# Patient Record
Sex: Male | Born: 1935 | Race: White | Hispanic: No | Smoking: Former smoker
Health system: Southern US, Community
[De-identification: ages and names within clinical notes are randomized; demographics above are authoritative.]

## PROBLEM LIST (undated history)

## (undated) DIAGNOSIS — IMO0002 Reserved for concepts with insufficient information to code with codable children: Secondary | ICD-10-CM

## (undated) DIAGNOSIS — I219 Acute myocardial infarction, unspecified: Secondary | ICD-10-CM

## (undated) DIAGNOSIS — Z789 Other specified health status: Secondary | ICD-10-CM

## (undated) DIAGNOSIS — K449 Diaphragmatic hernia without obstruction or gangrene: Secondary | ICD-10-CM

## (undated) DIAGNOSIS — E785 Hyperlipidemia, unspecified: Secondary | ICD-10-CM

## (undated) DIAGNOSIS — I2510 Atherosclerotic heart disease of native coronary artery without angina pectoris: Secondary | ICD-10-CM

## (undated) DIAGNOSIS — G629 Polyneuropathy, unspecified: Secondary | ICD-10-CM

## (undated) DIAGNOSIS — J189 Pneumonia, unspecified organism: Secondary | ICD-10-CM

## (undated) DIAGNOSIS — I779 Disorder of arteries and arterioles, unspecified: Secondary | ICD-10-CM

## (undated) DIAGNOSIS — K219 Gastro-esophageal reflux disease without esophagitis: Secondary | ICD-10-CM

## (undated) DIAGNOSIS — M1990 Unspecified osteoarthritis, unspecified site: Secondary | ICD-10-CM

## (undated) DIAGNOSIS — I739 Peripheral vascular disease, unspecified: Secondary | ICD-10-CM

## (undated) DIAGNOSIS — K279 Peptic ulcer, site unspecified, unspecified as acute or chronic, without hemorrhage or perforation: Secondary | ICD-10-CM

## (undated) DIAGNOSIS — Z951 Presence of aortocoronary bypass graft: Secondary | ICD-10-CM

## (undated) DIAGNOSIS — Z974 Presence of external hearing-aid: Secondary | ICD-10-CM

## (undated) DIAGNOSIS — I10 Essential (primary) hypertension: Secondary | ICD-10-CM

## (undated) DIAGNOSIS — R634 Abnormal weight loss: Secondary | ICD-10-CM

## (undated) DIAGNOSIS — R9430 Abnormal result of cardiovascular function study, unspecified: Secondary | ICD-10-CM

## (undated) HISTORY — PX: JOINT REPLACEMENT: SHX530

## (undated) HISTORY — PX: CORONARY ARTERY BYPASS GRAFT: SHX141

## (undated) HISTORY — DX: Presence of aortocoronary bypass graft: Z95.1

## (undated) HISTORY — DX: Essential (primary) hypertension: I10

## (undated) HISTORY — DX: Abnormal weight loss: R63.4

## (undated) HISTORY — DX: Hyperlipidemia, unspecified: E78.5

## (undated) HISTORY — DX: Other specified health status: Z78.9

## (undated) HISTORY — DX: Polyneuropathy, unspecified: G62.9

## (undated) HISTORY — DX: Disorder of arteries and arterioles, unspecified: I77.9

## (undated) HISTORY — DX: Reserved for concepts with insufficient information to code with codable children: IMO0002

## (undated) HISTORY — PX: HAND SURGERY: SHX662

## (undated) HISTORY — DX: Peripheral vascular disease, unspecified: I73.9

## (undated) HISTORY — DX: Abnormal result of cardiovascular function study, unspecified: R94.30

## (undated) HISTORY — DX: Atherosclerotic heart disease of native coronary artery without angina pectoris: I25.10

## (undated) HISTORY — PX: CARDIAC CATHETERIZATION: SHX172

## (undated) HISTORY — DX: Diaphragmatic hernia without obstruction or gangrene: K44.9

## (undated) HISTORY — DX: Peptic ulcer, site unspecified, unspecified as acute or chronic, without hemorrhage or perforation: K27.9

---

## 1998-05-18 ENCOUNTER — Encounter: Payer: Self-pay | Admitting: Gastroenterology

## 1998-05-18 ENCOUNTER — Ambulatory Visit (HOSPITAL_COMMUNITY): Admission: RE | Admit: 1998-05-18 | Discharge: 1998-05-18 | Payer: Self-pay | Admitting: Gastroenterology

## 1998-10-05 ENCOUNTER — Encounter: Payer: Self-pay | Admitting: Gastroenterology

## 1998-10-05 ENCOUNTER — Ambulatory Visit (HOSPITAL_COMMUNITY): Admission: RE | Admit: 1998-10-05 | Discharge: 1998-10-05 | Payer: Self-pay | Admitting: Gastroenterology

## 2000-01-10 ENCOUNTER — Encounter: Admission: RE | Admit: 2000-01-10 | Discharge: 2000-01-10 | Payer: Self-pay | Admitting: Gastroenterology

## 2000-01-10 ENCOUNTER — Encounter: Payer: Self-pay | Admitting: Gastroenterology

## 2000-03-06 ENCOUNTER — Encounter: Payer: Self-pay | Admitting: Gastroenterology

## 2000-03-06 ENCOUNTER — Encounter: Admission: RE | Admit: 2000-03-06 | Discharge: 2000-03-06 | Payer: Self-pay | Admitting: Gastroenterology

## 2000-07-23 ENCOUNTER — Ambulatory Visit (HOSPITAL_COMMUNITY): Admission: RE | Admit: 2000-07-23 | Discharge: 2000-07-23 | Payer: Self-pay | Admitting: *Deleted

## 2000-07-29 ENCOUNTER — Encounter: Payer: Self-pay | Admitting: Thoracic Surgery (Cardiothoracic Vascular Surgery)

## 2000-07-31 ENCOUNTER — Inpatient Hospital Stay (HOSPITAL_COMMUNITY)
Admission: RE | Admit: 2000-07-31 | Discharge: 2000-08-06 | Payer: Self-pay | Admitting: Thoracic Surgery (Cardiothoracic Vascular Surgery)

## 2000-07-31 ENCOUNTER — Encounter: Payer: Self-pay | Admitting: Thoracic Surgery (Cardiothoracic Vascular Surgery)

## 2000-08-01 ENCOUNTER — Encounter: Payer: Self-pay | Admitting: Thoracic Surgery (Cardiothoracic Vascular Surgery)

## 2000-08-02 ENCOUNTER — Encounter: Payer: Self-pay | Admitting: Thoracic Surgery (Cardiothoracic Vascular Surgery)

## 2000-09-02 ENCOUNTER — Encounter (HOSPITAL_COMMUNITY): Admission: RE | Admit: 2000-09-02 | Discharge: 2000-12-01 | Payer: Self-pay | Admitting: Cardiology

## 2000-11-11 ENCOUNTER — Ambulatory Visit (HOSPITAL_COMMUNITY): Admission: RE | Admit: 2000-11-11 | Discharge: 2000-11-11 | Payer: Self-pay | Admitting: General Surgery

## 2000-12-02 ENCOUNTER — Encounter (HOSPITAL_COMMUNITY): Admission: RE | Admit: 2000-12-02 | Discharge: 2000-12-17 | Payer: Self-pay | Admitting: Cardiology

## 2001-06-17 ENCOUNTER — Ambulatory Visit (HOSPITAL_COMMUNITY): Admission: RE | Admit: 2001-06-17 | Discharge: 2001-06-17 | Payer: Self-pay | Admitting: Gastroenterology

## 2003-06-27 ENCOUNTER — Encounter: Admission: RE | Admit: 2003-06-27 | Discharge: 2003-06-27 | Payer: Self-pay | Admitting: Family Medicine

## 2004-09-27 ENCOUNTER — Ambulatory Visit: Payer: Self-pay | Admitting: Cardiology

## 2004-12-24 ENCOUNTER — Ambulatory Visit: Payer: Self-pay | Admitting: Cardiology

## 2005-06-06 ENCOUNTER — Ambulatory Visit: Payer: Self-pay | Admitting: Cardiology

## 2005-12-12 ENCOUNTER — Ambulatory Visit: Payer: Self-pay | Admitting: Cardiology

## 2005-12-24 ENCOUNTER — Ambulatory Visit: Payer: Self-pay | Admitting: Cardiology

## 2006-01-07 ENCOUNTER — Ambulatory Visit: Payer: Self-pay

## 2006-07-14 ENCOUNTER — Ambulatory Visit: Payer: Self-pay | Admitting: Cardiology

## 2006-07-14 LAB — CONVERTED CEMR LAB
Albumin: 3.9 g/dL (ref 3.5–5.2)
Bilirubin, Direct: 0.1 mg/dL (ref 0.0–0.3)
Cholesterol: 127 mg/dL (ref 0–200)
HDL: 31.9 mg/dL — ABNORMAL LOW (ref >39.0)
Total Bilirubin: 1 mg/dL (ref 0.3–1.2)
Total Protein: 6.6 g/dL (ref 6.0–8.3)
VLDL: 27 mg/dL (ref 0–40)

## 2006-12-04 ENCOUNTER — Ambulatory Visit: Payer: Self-pay | Admitting: Cardiology

## 2007-01-13 ENCOUNTER — Ambulatory Visit: Payer: Self-pay | Admitting: Cardiology

## 2007-01-13 LAB — CONVERTED CEMR LAB
AST: 21 units/L (ref 0–37)
Albumin: 3.8 g/dL (ref 3.5–5.2)
Alkaline Phosphatase: 66 units/L (ref 39–117)
Bilirubin, Direct: 0.2 mg/dL (ref 0.0–0.3)
HDL: 25.6 mg/dL — ABNORMAL LOW (ref >39.0)
LDL Cholesterol: 66 mg/dL (ref 0–99)
Total Bilirubin: 1 mg/dL (ref 0.3–1.2)
Total CHOL/HDL Ratio: 4.1
Triglycerides: 69 mg/dL (ref 0–149)
VLDL: 14 mg/dL (ref 0–40)

## 2007-05-20 ENCOUNTER — Encounter: Admission: RE | Admit: 2007-05-20 | Discharge: 2007-05-20 | Payer: Self-pay | Admitting: Otolaryngology

## 2007-07-16 ENCOUNTER — Ambulatory Visit: Payer: Self-pay | Admitting: Cardiology

## 2007-07-16 LAB — CONVERTED CEMR LAB
ALT: 15 units/L (ref 0–53)
AST: 21 units/L (ref 0–37)
Cholesterol: 117 mg/dL (ref 0–200)
HDL: 27.4 mg/dL — ABNORMAL LOW (ref >39.0)
LDL Cholesterol: 68 mg/dL (ref 0–99)
Total CHOL/HDL Ratio: 4.3
Total Protein: 6.4 g/dL (ref 6.0–8.3)

## 2008-01-12 ENCOUNTER — Ambulatory Visit: Payer: Self-pay | Admitting: Cardiology

## 2008-01-28 ENCOUNTER — Ambulatory Visit: Payer: Self-pay | Admitting: Cardiology

## 2008-01-28 LAB — CONVERTED CEMR LAB
Cholesterol: 119 mg/dL (ref 0–200)
Total Protein: 6.8 g/dL (ref 6.0–8.3)
VLDL: 13 mg/dL (ref 0–40)

## 2009-01-09 ENCOUNTER — Encounter: Payer: Self-pay | Admitting: Cardiology

## 2009-01-10 ENCOUNTER — Ambulatory Visit: Payer: Self-pay | Admitting: Cardiology

## 2009-01-11 ENCOUNTER — Ambulatory Visit: Payer: Self-pay | Admitting: Cardiology

## 2009-01-12 ENCOUNTER — Encounter: Payer: Self-pay | Admitting: Cardiology

## 2009-01-12 LAB — CONVERTED CEMR LAB
AST: 25 units/L (ref 0–37)
Albumin: 3.8 g/dL (ref 3.5–5.2)
Cholesterol: 132 mg/dL (ref 0–200)
HDL: 35.6 mg/dL — ABNORMAL LOW (ref >39.00)
LDL Cholesterol: 78 mg/dL (ref 0–99)
Triglycerides: 90 mg/dL (ref 0.0–149.0)
VLDL: 18 mg/dL (ref 0.0–40.0)

## 2009-07-04 ENCOUNTER — Encounter: Payer: Self-pay | Admitting: Cardiology

## 2009-07-12 ENCOUNTER — Ambulatory Visit: Payer: Self-pay | Admitting: Cardiology

## 2009-07-13 ENCOUNTER — Encounter: Payer: Self-pay | Admitting: Cardiology

## 2009-07-13 LAB — CONVERTED CEMR LAB
ALT: 18 units/L (ref 0–53)
AST: 25 units/L (ref 0–37)
Albumin: 4 g/dL (ref 3.5–5.2)
Alkaline Phosphatase: 59 units/L (ref 39–117)
Bilirubin, Direct: 0.2 mg/dL (ref 0.0–0.3)
Cholesterol: 134 mg/dL (ref 0–200)
LDL Cholesterol: 65 mg/dL (ref 0–99)
Total Bilirubin: 0.8 mg/dL (ref 0.3–1.2)
Total CHOL/HDL Ratio: 3
Total Protein: 6.7 g/dL (ref 6.0–8.3)
Triglycerides: 153 mg/dL — ABNORMAL HIGH (ref 0.0–149.0)

## 2009-08-21 ENCOUNTER — Telehealth (INDEPENDENT_AMBULATORY_CARE_PROVIDER_SITE_OTHER): Payer: Self-pay | Admitting: *Deleted

## 2009-08-23 ENCOUNTER — Encounter: Admission: RE | Admit: 2009-08-23 | Discharge: 2009-08-23 | Payer: Self-pay | Admitting: Orthopedic Surgery

## 2009-08-28 ENCOUNTER — Inpatient Hospital Stay (HOSPITAL_COMMUNITY): Admission: RE | Admit: 2009-08-28 | Discharge: 2009-09-04 | Payer: Self-pay | Admitting: Orthopedic Surgery

## 2009-09-27 ENCOUNTER — Telehealth: Payer: Self-pay | Admitting: Cardiology

## 2009-10-04 ENCOUNTER — Ambulatory Visit: Payer: Self-pay | Admitting: Cardiology

## 2009-12-29 ENCOUNTER — Encounter: Payer: Self-pay | Admitting: Cardiology

## 2010-01-02 ENCOUNTER — Ambulatory Visit: Payer: Self-pay | Admitting: Cardiology

## 2010-01-22 ENCOUNTER — Telehealth: Payer: Self-pay | Admitting: Cardiology

## 2010-02-07 ENCOUNTER — Ambulatory Visit: Payer: Self-pay

## 2010-02-07 ENCOUNTER — Ambulatory Visit: Payer: Self-pay | Admitting: Cardiology

## 2010-05-27 LAB — CONVERTED CEMR LAB
Cholesterol: 141 mg/dL (ref 0–200)
Total CHOL/HDL Ratio: 4
VLDL: 13.6 mg/dL (ref 0.0–40.0)

## 2010-05-31 NOTE — Progress Notes (Signed)
   Faxed all Cardiac over to [facility]/[phone] [facility]  August 21, 2009 9:36 AM

## 2010-05-31 NOTE — Progress Notes (Signed)
Summary: bp readings   Phone Note Call from Patient Call back at Home Phone [phone]   Caller: pt Reason for Call: Talk to Nurse Summary of Call: pt was told to call with bp readings. highest reading was 159/80 on 01/17/10 lowest reading 144/72 on 01/22/10  Initial call taken by: Faythe Ghee,  January 22, 2010 1:12 PM  Follow-up for Phone Call        will forward to Dr Myrtis Ser for review Meredith Staggers, RN  January 22, 2010 5:28 PM   Additional Follow-up for Phone Call Additional follow up Details #1::        This is OK for now  Talitha Givens, MD, [facility]  January 22, 2010 5:30 PM  pt is aware and will cont. to monitor Meredith Staggers, RN  January 23, 2010 9:29 AM

## 2010-05-31 NOTE — Letter (Signed)
Summary: Custom - Lipid  [facility], Main Office  [address]   [place], [place] [address]   Phone: [phone]  Fax: [phone]     July 13, 2009 MRN: [id number]   [facility] [address] [place], [place]  [address]   Dear Mr. Wigington,  We have reviewed your cholesterol results.  They are as follows:     Total Cholesterol:    134 (Desirable: less than 200)       HDL  Cholesterol:     38.50  (Desirable: greater than 40 for men and 50 for women)       LDL Cholesterol:       65  (Desirable: less than 100 for low risk and less than 70 for moderate to high risk)       Triglycerides:       153.0  (Desirable: less than 150)  Our recommendations include:  Looks Good, continue current mediations   Call our office at the number listed above if you have any questions.  Lowering your LDL cholesterol is important, but it is only one of a large number of "risk factors" that may indicate that you are at risk for heart disease, stroke or other complications of hardening of the arteries.  Other risk factors include:   A.  Cigarette Smoking* B.  High Blood Pressure* C.  Obesity* D.   Low HDL Cholesterol (see yours above)* E.   Diabetes Mellitus (higher risk if your is uncontrolled) F.  Family history of premature heart disease G.  Previous history of stroke or cardiovascular disease    *These are risk factors YOU HAVE CONTROL OVER.  For more information, visit .  There is now evidence that lowering the TOTAL CHOLESTEROL AND LDL CHOLESTEROL can reduce the risk of heart disease.  The American Heart Association recommends the following guidelines for the treatment of elevated cholesterol:  1.  If there is now current heart disease and less than two risk factors, TOTAL CHOLESTEROL should be less than 200 and LDL CHOLESTEROL should be less than 100. 2.  If there is current heart disease or two or more risk factors, TOTAL CHOLESTEROL should be less than 200 and LDL  CHOLESTEROL should be less than 70.  A diet low in cholesterol, saturated fat, and calories is the cornerstone of treatment for elevated cholesterol.  Cessation of smoking and exercise are also important in the management of elevated cholesterol and preventing vascular disease.  Studies have shown that 30 to 60 minutes of physical activity most days can help lower blood pressure, lower cholesterol, and keep your weight at a healthy level.  Drug therapy is used when cholesterol levels do not respond to therapeutic lifestyle changes (smoking cessation, diet, and exercise) and remains unacceptably high.  If medication is started, it is important to have you levels checked periodically to evaluate the need for further treatment options.  Thank you,    [facility] [employer] Team

## 2010-05-31 NOTE — Assessment & Plan Note (Signed)
Summary: bp check started amlodipine 5mg  6/2  Nurse Visit   Vital Signs:  Patient profile:   77 year old male Pulse rate:   60 / minute Resp:     16 per minute BP sitting:   160 / 68  (right arm)  Vitals Entered By: n terbeck  Preventive Screening-Counseling & Management  Comments: rechecked with automatic cuff--143/65--62--16  Allergies: No Known Drug Allergies  Appended Document: bp check started amlodipine 5mg  6/2 OK for now.   Appended Document: bp check started amlodipine 5mg  6/2 pt aware will check BP when at pharmacy and call if running high

## 2010-05-31 NOTE — Assessment & Plan Note (Signed)
Summary: YEARLY/SL  Medications Added ASPIRIN 81 MG TBEC (ASPIRIN) Take one tablet by mouth daily SIMVASTATIN 20 MG TABS (SIMVASTATIN) Take one tablet by mouth daily at bedtime GABAPENTIN 600 MG TABS (GABAPENTIN) three times a day * SULFAPYRIDINE 500MG  1/2 by mouth daily AMLODIPINE BESYLATE 10 MG TABS (AMLODIPINE BESYLATE) Take one tablet by mouth daily MULTIVITAMINS   TABS (MULTIPLE VITAMIN) Take 1 tablet by mouth once a day OSTEO BI-FLEX REGULAR STRENGTH 250-200 MG TABS (GLUCOSAMINE-CHONDROITIN) 2 tabs daily      Allergies Added: NKDA  Visit Type:  Follow-up Primary Provider:  Marjory Lies, MD  CC:  CAD.  History of Present Illness: The patient is seen for followup of coronary artery disease, hypertension, hyperlipidemia.  He brought his blood pressure cuff today.  He is a machine when compared to ours gives a systolic reading that is approximately 5 mmHg  higher than our cuff. Is feeling well.  He has no chest pain or shortness of breath. I saw him last January 10, 2009.  Current Medications (verified): 1)  Metoprolol Tartrate 50 Mg Tabs (Metoprolol Tartrate) .... Take One-Half  Tablet By Mouth Three Times A Day. 2)  Aspirin 81 Mg Tbec (Aspirin) .... Take One Tablet By Mouth Daily 3)  Simvastatin 40 Mg Tabs (Simvastatin) .... Take One Tablet By Mouth Daily At Bedtime 4)  Gabapentin 600 Mg Tabs (Gabapentin) .... Three Times A Day 5)  Fish Oil   Oil (Fish Oil) .... 1000mg  3 Daily 6)  Ranitidine Hcl 300 Mg Caps (Ranitidine Hcl) .... Take One Tablet By Mouth Once Daily. 7)  Sulfapyridine 500mg  .... 1/2 By Mouth Daily 8)  Amlodipine Besylate 5 Mg Tabs (Amlodipine Besylate) .... Take One Tablet By Mouth Daily 9)  Multivitamins   Tabs (Multiple Vitamin) .... Take 1 Tablet By Mouth Once A Day 10)  Osteo Bi-Flex Regular Strength 250-200 Mg Tabs (Glucosamine-Chondroitin) .... 2 Tabs Daily  Allergies (verified): No Known Drug Allergies  Past History:  Past Medical  History:  DYSLIPIDEMIA (ICD-272.4) * NIACIN INTOLERANCE CORONARY ARTERY BYPASS GRAFT, HX OF (ICD-V45.81)...2002 CAD (ICD-414.00)...no ischemia nuclear scan..12/2005.Marland Kitchen.EF  60% EF 60%   nuclear... 2007   /    (no echo as of 12/29/2009) Atrial flutter....limited post CABG Hiatal Hernia Peptic ulcer disease Hypertension  Review of Systems       Patient denies fever, chills, headache, sweats, rash, change in vision, change in hearing, chest pain, cough, nausea vomiting, urinary symptoms.  All of the systems are reviewed and are negative.  Vital Signs:  Patient profile:   77 year old male Height:      73 inches Weight:      212 pounds BMI:     28.07 O2 Sat:      62 % BP sitting:   162 / 54  (left arm) Cuff size:   regular  Vitals Entered By: Hardin Negus, RMA (January 02, 2010 8:59 AM)  Physical Exam  General:  The patient is very stable today. Head:  head is atraumatic. Eyes:  no xanthelasma. Neck:  no jugular venous distention.  Question soft left carotid bruit. Chest Wall:  no chest wall tenderness. Lungs:  lungs are clear.  Respiratory effort is nonlabored. Heart:  cardiac exam reveals S1-S2.  There no clicks or significant murmurs. Abdomen:  abdomen soft. Msk:  no musculoskeletal deformities. Extremities:  no peripheral edema. Skin:  no skin rashes. Psych:  patient is oriented to person time and place.  Affect is normal.   Impression & Recommendations:  Problem # 1:  * QUESTION SOFT LEFT CAROTID BRUIT With question of a soft left carotid bruit carotid Doppler will be ordered.  Patient has known vascular disease.  We will be in touch with him with the results.  Problem # 2:  HYPERTENSION (ICD-401.9)  His updated medication list for this problem includes:    Metoprolol Tartrate 50 Mg Tabs (Metoprolol tartrate) .Marland Kitchen... Take one-half  tablet by mouth three times a day.    Aspirin 81 Mg Tbec (Aspirin) .Marland Kitchen... Take one tablet by mouth daily    Amlodipine Besylate 10 Mg  Tabs (Amlodipine besylate) .Marland Kitchen... Take one tablet by mouth daily Systolic blood pressures running hard and I would like to see it.  We will increase his amlodipine to 10 mg daily.  He will record his blood pressures and send them to [place].  Problem # 3:  DYSLIPIDEMIA (ICD-272.4)  His updated medication list for this problem includes:    Simvastatin 20 Mg Tabs (Simvastatin) .Marland Kitchen... Take one tablet by mouth daily at bedtime FDA recommendation states he cannot use 40 mg of simvastatin when he is on amlodipine.  I reviewed his labs and his LDL is been under very good control.  Therefore will lower his simvastatin 20 mg along with the amlodipine.  Problem # 4:  CAD (ICD-414.00)  His updated medication list for this problem includes:    Metoprolol Tartrate 50 Mg Tabs (Metoprolol tartrate) .Marland Kitchen... Take one-half  tablet by mouth three times a day.    Aspirin 81 Mg Tbec (Aspirin) .Marland Kitchen... Take one tablet by mouth daily    Amlodipine Besylate 10 Mg Tabs (Amlodipine besylate) .Marland Kitchen... Take one tablet by mouth daily  Orders: EKG w/ Interpretation (93000) Coronary disease is stable.  EKG is done today and reviewed by me.  There are diffuse nonspecific ST-T wave changes. I have compared this to a prior tracing and there is no significant change.  His overall cardiac status stable.  Carotid Doppler will be done and blood pressure medicine changes are seen for followup in one year.  Other Orders: Carotid Duplex (Carotid Duplex)  Patient Instructions: 1)  Increase Amlodipine to 10mg  daily 2)  Decrease Simvastatin to 20mg  daily 3)  Your physician recommends that you return for a FASTING lipid profile: in 6 weeks (272.2) 4)  Your physician has requested that you have a carotid duplex. This test is an ultrasound of the carotid arteries in your neck. It looks at blood flow through these arteries that supply the brain with blood. Allow one hour for this exam. There are no restrictions or special instructions. 5)  Your  physician has requested that you regularly monitor and record your blood pressure readings at home.  Please use the same machine at the same time of day to check your readings and record them.  Give me a call in a couple of weeks with the readings, [phone] Ethel Rana. 6)  Your physician wants you to follow-up in:  1 year.  You will receive a reminder letter in the mail two months in advance. If you don't receive a letter, please call our office to schedule the follow-up appointment. Prescriptions: METOPROLOL TARTRATE 50 MG TABS (METOPROLOL TARTRATE) Take one-half  tablet by mouth three times a day.  #145 x 3   Entered by:   Meredith Staggers, RN   Authorized by:   Talitha Givens, MD, [facility]   Signed by:   Meredith Staggers, RN on 01/02/2010   Method used:   Electronically to  [facility]  [address]  [phone]* (retail)       [address]       [place], [place]  [address]       Ph: [phone] or [phone]       Fax: [phone]   RxID:   [id number] AMLODIPINE BESYLATE 10 MG TABS (AMLODIPINE BESYLATE) Take one tablet by mouth daily  #90 x 3   Entered by:   Meredith Staggers, RN   Authorized by:   Talitha Givens, MD, [facility]   Signed by:   Meredith Staggers, RN on 01/02/2010   Method used:   Electronically to        [facility]  [phone]* (retail)       [address]       [place], [place]  [address]       Ph: [phone] or [phone]       Fax: [phone]   RxID:   [id number] SIMVASTATIN 20 MG TABS (SIMVASTATIN) Take one tablet by mouth daily at bedtime  #90 x 3   Entered by:   Meredith Staggers, RN   Authorized by:   Talitha Givens, MD, [facility]   Signed by:   Meredith Staggers, RN on 01/02/2010   Method used:   Electronically to        [facility]  [phone]* (retail)       [address]       [place], [place]  [address]       Ph: [phone] or [phone]       Fax: [phone]    RxID:   [id number]

## 2010-05-31 NOTE — Letter (Signed)
Summary: Lipid Reminder  [facility], Main Office  [address]   [place], [place] [address]   Phone: [phone]  Fax: [phone]        July 04, 2009 MRN: [id number]    JEMERY STACEY [address] [place], [place]  [address]    Dear Mr. Priore,  Our records indicate it is time to check your cholesterol.  Please call our office to schedule an appt for labwork.  Please remember it is a fasting lab.     Sincerely,  Meredith Staggers, RN Willa Rough, MD This letter has been electronically signed by your physician.

## 2010-05-31 NOTE — Progress Notes (Signed)
Summary: B/P RUNNING HIGH  Medications Added AMLODIPINE BESYLATE 5 MG TABS (AMLODIPINE BESYLATE) Take one tablet by mouth daily       Phone Note Other Incoming   CallerKaroline Caldwell [phone] PHYSICAL THERAPY Summary of Call: PT B/P RUNNING A LITTLE HIGH TODAY 172/73 Initial call taken by: Judie Grieve,  September 27, 2009 8:52 AM  Follow-up for Phone Call        on recheck it came down to 164, last week was running 160s, HR running in 60s no complaints no headache, no dizziness, pt is on Metoprolol Tartrate 25mg  three times a day, will send mess to Dr Myrtis Ser for review and call pt back Meredith Staggers, RN  September 27, 2009 9:13 AM   Additional Follow-up for Phone Call Additional follow up Details #1::        199/74 today, she will hold off on physical therapy today, will discuss w/Dr Myrtis Ser and call pt back today Meredith Staggers, RN  September 28, 2009 8:59 AM     Additional Follow-up for Phone Call Additional follow up Details #2::    Start amlodipine 5mg  daily and arrange for BP follow-up.  Myrtis Ser  pt aware rx sent into pharmacy, tom is last day for his therapist, he is not able to check BP at home, sch for BP check here on Wed 6/8 Meredith Staggers, RN  September 28, 2009 2:10 PM   New/Updated Medications: AMLODIPINE BESYLATE 5 MG TABS (AMLODIPINE BESYLATE) Take one tablet by mouth daily Prescriptions: AMLODIPINE BESYLATE 5 MG TABS (AMLODIPINE BESYLATE) Take one tablet by mouth daily  #30 x 6   Entered by:   Meredith Staggers, RN   Authorized by:   Talitha Givens, MD, [facility]   Signed by:   Meredith Staggers, RN on 09/28/2009   Method used:   Electronically to        [facility]  [phone]* (retail)       [address]       [place], [place]  [address]       Ph: [phone] or [phone]       Fax: [phone]   RxID:   [id number]

## 2010-05-31 NOTE — Miscellaneous (Signed)
  Clinical Lists Changes  Observations: Added new observation of PAST MED HX:  DYSLIPIDEMIA (ICD-272.4) * NIACIN INTOLERANCE CORONARY ARTERY BYPASS GRAFT, HX OF (ICD-V45.81)...2002 CAD (ICD-414.00)...no ischemia nuclear scan..12/2005.Marland Kitchen.EF  60% EF 60%   nuclear... 2007   /    (no echo as of 12/29/2009) Atrial flutter....limited post CABG Hiatal Hernia Peptic ulcer disease  (12/29/2009 15:45) Added new observation of PRIMARY MD: Marjory Lies, MD (12/29/2009 15:45)       Past History:  Past Medical History:  DYSLIPIDEMIA (ICD-272.4) * NIACIN INTOLERANCE CORONARY ARTERY BYPASS GRAFT, HX OF (ICD-V45.81)...2002 CAD (ICD-414.00)...no ischemia nuclear scan..12/2005.Marland Kitchen.EF  60% EF 60%   nuclear... 2007   /    (no echo as of 12/29/2009) Atrial flutter....limited post CABG Hiatal Hernia Peptic ulcer disease

## 2010-07-17 LAB — BASIC METABOLIC PANEL
BUN: 11 mg/dL (ref 6–23)
BUN: 14 mg/dL (ref 6–23)
BUN: 15 mg/dL (ref 6–23)
CO2: 32 mEq/L (ref 19–32)
CO2: 33 mEq/L — ABNORMAL HIGH (ref 19–32)
Calcium: 8.1 mg/dL — ABNORMAL LOW (ref 8.4–10.5)
Chloride: 101 mEq/L (ref 96–112)
Chloride: 101 mEq/L (ref 96–112)
Creatinine, Ser: 1.29 mg/dL (ref 0.4–1.5)
GFR calc Af Amer: >60 mL/min (ref >60)
GFR calc Af Amer: >60 mL/min (ref >60)
GFR calc non Af Amer: 54 mL/min — ABNORMAL LOW (ref >60)
GFR calc non Af Amer: 59 mL/min — ABNORMAL LOW (ref >60)
Glucose, Bld: 125 mg/dL — ABNORMAL HIGH (ref 70–99)
Glucose, Bld: 141 mg/dL — ABNORMAL HIGH (ref 70–99)
Potassium: 4.1 mEq/L (ref 3.5–5.1)
Potassium: 5.2 mEq/L — ABNORMAL HIGH (ref 3.5–5.1)
Sodium: 136 mEq/L (ref 135–145)
Sodium: 136 mEq/L (ref 135–145)
Sodium: 136 mEq/L (ref 135–145)
Sodium: 136 mEq/L (ref 135–145)

## 2010-07-17 LAB — COMPREHENSIVE METABOLIC PANEL
ALT: 14 U/L (ref 0–53)
Albumin: 3.9 g/dL (ref 3.5–5.2)
Alkaline Phosphatase: 64 U/L (ref 39–117)
BUN: 13 mg/dL (ref 6–23)
Potassium: 4.1 mEq/L (ref 3.5–5.1)
Sodium: 139 mEq/L (ref 135–145)
Total Protein: 6.5 g/dL (ref 6.0–8.3)

## 2010-07-17 LAB — URINE CULTURE
Culture: NO GROWTH
Special Requests: NEGATIVE

## 2010-07-17 LAB — PROTIME-INR
INR: 1.19 (ref 0.00–1.49)
INR: 1.35 (ref 0.00–1.49)
INR: 1.41 (ref 0.00–1.49)
INR: 2.14 — ABNORMAL HIGH (ref 0.00–1.49)
INR: 2.37 — ABNORMAL HIGH (ref 0.00–1.49)
INR: 0.96 (ref 0.00–1.49)
Prothrombin Time: 15 seconds (ref 11.6–15.2)
Prothrombin Time: 17.1 seconds — ABNORMAL HIGH (ref 11.6–15.2)
Prothrombin Time: 23.7 seconds — ABNORMAL HIGH (ref 11.6–15.2)

## 2010-07-17 LAB — URINALYSIS, ROUTINE W REFLEX MICROSCOPIC
Bilirubin Urine: NEGATIVE
Glucose, UA: NEGATIVE mg/dL
Hgb urine dipstick: NEGATIVE
Ketones, ur: NEGATIVE mg/dL
Nitrite: NEGATIVE
Protein, ur: NEGATIVE mg/dL
Specific Gravity, Urine: 1.012 (ref 1.005–1.030)
Urobilinogen, UA: 1 mg/dL (ref 0.0–1.0)
pH: 6.5 (ref 5.0–8.0)

## 2010-07-17 LAB — CLOSTRIDIUM DIFFICILE EIA

## 2010-07-17 LAB — ABO/RH: ABO/RH(D): O POS

## 2010-07-17 LAB — CBC
HCT: 34 % — ABNORMAL LOW (ref 39.0–52.0)
Hemoglobin: 9.4 g/dL — ABNORMAL LOW (ref 13.0–17.0)
MCHC: 33.6 g/dL (ref 30.0–36.0)
MCV: 94.3 fL (ref 78.0–100.0)
MCV: 96.2 fL (ref 78.0–100.0)
Platelets: 131 10*3/uL — ABNORMAL LOW (ref 150–400)
Platelets: 143 10*3/uL — ABNORMAL LOW (ref 150–400)
Platelets: 169 10*3/uL (ref 150–400)
RBC: 2.9 MIL/uL — ABNORMAL LOW (ref 4.22–5.81)
RDW: 12.9 % (ref 11.5–15.5)
RDW: 12.8 % (ref 11.5–15.5)

## 2010-07-17 LAB — TYPE AND SCREEN: Antibody Screen: NEGATIVE

## 2010-09-11 NOTE — Assessment & Plan Note (Signed)
[facility]                            CARDIOLOGY OFFICE NOTE   NAME:Jerry [name], Jerry [name]                         MRN:          [id number]  DATE:12/04/2006                            DOB:          Sep 17, 1929    Jerry [name] is doing well. He is post CABG in 2002. He is not having any  symptoms. He had a followup exercise test in 2007 showing no marked  ischemia. He has had no chest pain or shortness of breath. There has  been no syncope or pre syncope.   PAST MEDICAL HISTORY:   ALLERGIES:  No known drug allergies.   MEDICATIONS:  1. Metoprolol.  2. Sulfapyridine.  3. Aspirin.  4. Multivitamin.  5. Ranitidine.  6. Simvastatin 40.   OTHER MEDICAL PROBLEMS:  See the list below.   REVIEW OF SYSTEMS:  Negative. He feels well and he is doing well. Review  of systems is negative.   PHYSICAL EXAMINATION:  Weight is 205 pounds, blood pressure 135/62, with  a pulse of 52. Patient is oriented to person, time, and place. Affect is  normal.  HEENT: Reveals no xanthelasma. He has normal extraocular motion. There  are no carotid bruits. There is no jugular venous distension.  LUNGS: Clear. Respiratory effort is not labored.  CARDIAC EXAM: Reveals an S1 with an S2. There are no clicks or  significant murmurs.  ABDOMEN: Soft. He has normal bowel sounds.  He has normal distal pulses. There is no peripheral edema.   EKG: Reveals no significant change.   PROBLEMS:  Include;  1. Coronary disease, post coronary artery bypass grafting in 2002. He      is stable and needs no testing.  2. Good left ventricular function.  3. Limited postoperative atrial flutter that resolved.  4. Hiatal hernia and peptic ulcer disease.  5. History of dermatitis herpetiformis.  6. Abnormal lipids on medication. HE DID NOT TOLERATE NIACIN. He will      remain on Zocor.   Patient is stable. No further cardiac work up is needed.     Jerry Abed, MD, [facility]  Electronically  Signed    JDK/MedQ  DD: 12/04/2006  DT: 12/04/2006  Job #: [id number]   cc:   Marjory Lies, M.D.

## 2010-09-11 NOTE — Assessment & Plan Note (Signed)
[facility]                            CARDIOLOGY OFFICE NOTE   NAME:Lovecchio, LANSON RANDLE                         MRN:          [id number]  DATE:01/12/2008                            DOB:          07/02/1929    Mr. Sandlin once again is doing well.  He has underwent CABG in 2002 and  had an exercise test in 2007 with no ischemia.  He has no chest pain.  He has no shortness of breath.  He has no syncope or presyncope.  He is  fully active.   PAST MEDICAL HISTORY:   ALLERGIES:  He is intolerant of NIACIN.   MEDICATIONS:  1. Metoprolol.  2. Sulfapyridine.  3. Aspirin 81.  4. Multivitamin.  5. Simvastatin 40.  6. Fish oil.  7. Gabapentin.  8. Ranitidine.   OTHER MEDICAL PROBLEMS:  See the list below.   REVIEW OF SYSTEMS:  He has no complaints.  Review of systems is  negative.   PHYSICAL EXAMINATION:  VITAL SIGNS:  Blood pressure today is 157/75.  His weight is 203.  He will need blood pressure follow up.  In the past,  his blood pressure has not been elevated.  His pulse is 57.  GENERAL:  The patient is oriented to person, time, and place.  Affect is  normal.  HEENT:  No xanthelasma.  He has normal extraocular motion.  NECK:  There are no carotid bruits.  There is no jugular venous  distention.  LUNGS: Clear.  Respiratory effort is not labored.  CARDIAC:  An S1 with an S2.  There are no clicks or significant murmurs.  ABDOMEN:  Soft.  He has no peripheral edema.   EKG reveals sinus bradycardia with nonspecific ST-T wave changes.   PROBLEMS:  1. Coronary disease status post CABG in 2002.  He needs no testing at      this time.  2. History of good LV function.  3. Limited postoperative atrial flutter in 2002.  4. History of hiatal hernia and peptic ulcer disease.  5. History of dermatitis or pediformis.  6. Intolerance of NIACIN.  He does have a low HDL.  He does well on      Zocor and this will not be changed.   The patient is stable.  No  change in his meds.  I will see him for  cardiology follow up in the year.     Luis Abed, MD, [facility]  Electronically Signed    JDK/MedQ  DD: 01/12/2008  DT: 01/12/2008  Job #: [id number]   cc:   Marjory Lies, M.D.

## 2010-09-14 NOTE — Op Note (Signed)
[facility]  Patient:    Jerry [name], Jerry [name]                         MRN: [id number] Proc. Date: 11/11/00 Adm. Date:  [id number] Attending:  Carson Myrtle                           Operative Report  PREOPERATIVE DIAGNOSIS:  Anal fissure, persistent.  POSTOPERATIVE DIAGNOSIS:  Anal fissure, persistent.  PROCEDURE:  Surgical repair of anal fissure.  SURGEON:  Timothy E. Earlene Plater, M.D.  ANESTHESIA:  Local standby.  INDICATIONS:  The patient is stable, 70, and recent cardiac check up satisfactory.  He has had a persistent anal fissure that has failed conservative management.  After careful discussion of the operative procedure, he wishes to proceed.  DESCRIPTION OF PROCEDURE:  The patient was brought to the operating room and placed supine and IV sedation was given.  The patient placed in lithotomy. The perianal area inspected, prepped and draped in the usual fashion.  Quarter percent Marcaine with epinephrine was used to anesthetize the posterior half of the anal orifice.  Then, with lubrication, the anus was gently dilated. The internal sphincter was quite spastic and performed a left posterior internal sphincterotomy percutaneously with a 15 blade.  This allowed for further dilatation.  However, the external sphincter was obviously intact.  A small bivalve anoscope was inserted.  A deep _______ posterior anal fissure was seen.  It was debrided and cauterized.  This completed the procedure and there was no other significant pathology.  No bleeding or complications. Gelfoam gauze and a dry sterile dressing was applied.  Written and verbal instructions were given to the patient and his wife, and he will be seen and followed as an outpatient. DD:  11/11/00 TD:  11/12/00 Job: [id number]

## 2010-09-14 NOTE — Discharge Summary (Signed)
[place]. [facility]  Patient:    Jerry [name], Jerry [name]                         MRN: [id number] Adm. Date:  [id number] Disc. Date: 08/06/00 Attending:  Tressie Stalker Dictator:   Dominica Severin, P.A. CC:         Luis Abed, M.D. [facility]  Fuller Mandril, M.D.  Daisey Must, M.D. [facility]   Discharge Summary  DATE OF BIRTH:  06/03/1929  CARDIOLOGIST:  Luis Abed, M.D.  PRIMARY CARE PHYSICIAN:  Fuller Mandril, M.D., in [place].  PRIMARY ADMISSION DIAGNOSIS:  Severe three-vessel coronary artery disease with new onset class III to IV unstable angina.  SECONDARY DIAGNOSES/PRE-EXISTING CONDITIONS: 1. Hypercholesterolemia. 2. Hiatal hernia with history of peptic ulcer disease. 3. Dermatitis herpetiformis.  NEW DIAGNOSIS THIS ADMISSION:  Postoperative atrial flutter with rapid ventricular response which is resolved.  DISCHARGE DIAGNOSIS:  Severe three-vessel coronary artery disease with class IV unstable angina.  PROCEDURES:  Coronary artery bypass graft surgery x 5 with the following grafts placed:  Left internal mammary artery to the left anterior descending artery, saphenous vein graft to the circumflex #1 and circumflex #2, saphenous vein graft to the right coronary artery, saphenous vein graft to the ramus.  HOSPITAL COURSE:  This is a 77 year old Caucasian male who was referred by Dr. Gerri Spore as well as Dr. Willa Rough.  Patient was seen by Dr. Myrtis Ser and underwent a stress Cardiolite scan on July 01, 2000, which showed a small previous inferior myocardial infarction.  Patient had persistent symptoms and, then, was referred to have a cardiac catheterization done which was notable for finding three-vessel coronary artery disease and he was referred to Dr. Cornelius Moras for possible elective coronary revascularization.  He was seen in the office by Dr. Cornelius Moras on July 28, 2000.  It was then determined at that time that he would benefit from undergoing  coronary artery bypass graft surgery. Patient underwent this procedure as stated above on July 31, 2000, was tolerated well.  Patient did not receive any blood products and was off coronary bypass in normal sinus rhythm and no inotropic agents.  He was transferred to the SICU stable.  Patients postoperative course was essentially uneventful.  He did not have any respiratory complications nor cardiac complications.  He did develop some paroxysmal atrial flutter on postoperative day #3 with rapid ventricular response.  Patient was started on IV Cardizem.  This atrial flutter did resolve and patient remained in normal sinus rhythm.  He was ambulated daily by cardiac rehabilitation phase I and tolerated this well.  Patient is anticipated for discharge in the morning of August 06, 2000.  Patients discharge condition is stable and improved.  His  DISCHARGE MEDICATIONS: 1. Coated aspirin 325 mg daily. 2. Zantac 150 mg p.o. b.i.d. 3. Sulphapyridine 500 mg p.o. q.d. 4. Niacin 500 mg q.d. 5. Zocor 10 mg q.d. 6. Diltiazem 180 mg q.d. 7. Toprol XL 100 mg q.d. 8. Percocet one to two tablets q.4-6h. p.r.n. pain. 9. Multivitamin with iron daily with food.  ACTIVITY AND FOLLOW-UP:  Patient is instructed not to do any driving or any strenuous activity, to continue breathing exercises, to walk daily.  Patient is to follow a low-sodium, low-fat diet.  Patient is told he could shower and clean the wounds with mild soap and water, call the office if any wound problems arise as noted on the fact sheet.  Patient has  a follow-up appointment in two weeks with Dr. Myrtis Ser with a chest x-ray and to bring that chest x-ray to his appointment with Dr. Cornelius Moras on Sep 01, 2000, at 11 a.m. Patient is instructed to call Dr. Rosezetta Schlatter for a follow-up with him in four to six weeks. DD:  08/05/00 TD:  08/05/00 Job: [id number]

## 2010-09-14 NOTE — Op Note (Signed)
[place]. [facility]  Patient:    Jerry [name], Jerry [name]                         MRN: [id number] Proc. Date: 07/31/00 Adm. Date:  [id number] Attending:  Tressie Stalker CC:         Luis Abed, M.D. [facility]  Daisey Must, M.D. [facility]  Delorse Lek, M.D.   Operative Report  PREOPERATIVE DIAGNOSIS:  Severe three-vessel coronary artery disease with class 4 unstable angina.  POSTOPERATIVE DIAGNOSIS:  Severe three-vessel coronary artery disease with class 4 unstable angina.  OPERATION PERFORMED:  Median sternotomy for coronary artery bypass grafting x 5 (left internal mammary artery to distal left anterior descending coronary artery, saphenous vein graft to ramus intermediate branch, saphenous vein graft to first circumflex marginal branch and sequential saphenous vein graft to second circumflex marginal branch, saphenous vein graft to distal right coronary artery).  SURGEON:  Salvatore Decent. Cornelius Moras, M.D.  ASSISTANT:  Sherrie George, P.A.  ANESTHESIA:  General.  INDICATIONS FOR PROCEDURE:  The patient is a 77 year old white male from [place] followed by Dr. Fuller Mandril and Dr. Jerral Bonito and referred by Dr. Loraine Leriche Pulsipher for management of coronary artery disease.  The patient has a history of inferior myocardial infarction in 1996.  He now presents with a one-month history of progressive symptoms of angina with recent symptoms progressing to the point of class 4 symptoms at rest.  Cardiac catheterization performed by Dr. Gerri Spore demonstrates severe three-vessel coronary artery disease with normal left ventricular function.  OPERATIVE CONSENT:  The patient and his wife were counseled at length regarding the indications and potential benefits of coronary artery bypass grafting.  They understand the associated risks of surgery including but not limited to the risks of death, stroke, myocardial infarction, bleeding requiring blood transfusion, arrhythmia,  infection, and recurrent coronary artery disease.  All of their questions have been addressed.  DESCRIPTION OF PROCEDURE:  The patient was brought to the operating room on the above-mentioned date and invasive hemodynamic monitoring was established by the anesthesia service under the care and direction of Dr. Adonis Huguenin.  The patient was placed in supine position on the operating table.  Following induction of general endotracheal anesthesia, intravenous antibiotics were administered.  The patients chest, abdomen, both groins, and both lower extremities were prepared and draped in a sterile manner.  A median sternotomy incision was performed and the left internal mammary artery was dissected from the chest wall and prepared for bypass grafting. The left internal mammary artery was notably good quality conduit for bypass grafting.  Simultaneously saphenous vein was obtained from the patients left lower leg through a series of longitudinal incisions.  The saphenous vein was felt to be good quality conduit.  The patient was heparinized systemically.  The pericardium was opened.  The ascending aorta was inspected and was notably free of any palpable plaques or calcifications.  The ascending aorta and right atrium were cannulated for cardiopulmonary bypass.  Adequate heparinization was verified.  Cardiopulmonary bypass was begun and the surface of the heart was inspected. Distal sites were selected for coronary artery bypass grafting.  Of note, the distal right coronary artery gives off a very small posterior descending coronary artery and tiny posterolateral branches, none of which were large enough for bypass grafting.  Portions of saphenous vein and the left internal mammary artery were all trimmed to appropriate lengths.  A temperature probe was placed in the  left ventricular septum and a styrofoam pad was placed to protect the left phrenic nerve from thermal injury.  A cardioplegia  catheter was placed in the ascending aorta.  The patient was cooled to 30 degrees systemic temperature.  The aortic crossclamp was applied and cardioplegia was delivered in antegrade fashion through the aortic root.  Iced saline slush was applied for topical hypothermia.  The initial cardioplegic arrest was excellent and myocardial cooling was felt to be excellent.  Repeat doses of cardioplegia were administered intermittently throughout the crossclamp portion of the operation both through the aortic root and down subsequently placed vein grafts to maintain septal temperature below 15 degrees centigrade.  The following distal coronary anastomoses were performed:  (1) The distal right coronary artery was grafted with a saphenous vein graft in end-to-side fashion.  This coronary was grafted at the site of the take-off of the posterior descending coronary artery.  The posterior descending coronary artery was too small for grafting individually.  The right coronary artery was of fair quality at the site of distal bypass and was chronically occluded proximally.  (2) The first circumflex marginal branch was grafted with a saphenous vein graft in a side-to-side fashion using a side branch off the vein.  This coronary measures 1.6 mm in diameter and is of good quality.  The second circumflex marginal branch was grafted using a sequential saphenous vein graft off of the vein placed in the first circumflex marginal branch.  This coronary measured 1.4 mm in diameter and was of good quality.  (4) The ramus intermediate branch was grafted with a saphenous vein graft in an end-to-side fashion.  This coronary measured 1.5 mm in diameter and was of good quality.  (5) The distal left anterior descending coronary artery was grafted with left internal mammary artery using running 8-0 Prolene suture.  This coronary measured 1.5 mm at the  site of distal bypass.  The site of the distal anastomosis was fairly  far distal down the interventricular septum due to the fact that the left anterior descending coronary artery was intramyocardial during the majority of its course.  It was of good quality at the site of distal bypass.  All three proximal saphenous vein anastomoses were performed directly to the ascending aorta prior to removal of the aortic crossclamp.  The septal temperature was noted to rise rapidly and dramatically upon reperfusion of the left internal mammary artery.  The aortic crossclamp was removed after a total crossclamp time of 82 minutes.  The heart was defibrillated into normal sinus rhythm.  All proximal and distal anastomoses were inspected for hemostasis and appropriate graft orientation. Epicardial pacing wires were fixed to the right ventricular outflow tract and to the right atrial appendage.  The patient was rewarmed to greater than 37 degrees centigrade temperature.  The patient was weaned from cardiopulmonary bypass without difficulty.  The patients rhythm at separation from bypass was normal sinus rhythm.  No inotropic support was required.  Total cardiopulmonary bypass time for the operation was 106 minutes.  The venous and arterial cannulae were removed uneventfully.  Protamine was administered to reverse the anticoagulation.  The mediastinum and the left chest were drained with three chest tubes placed through separate stab incisions inferiorly.  The median sternotomy was closed in a routine fashion. The left lower extremity incisions were closed in multiple layers in routine fashion.  All skin incisions were closed with subcuticular skin closures.  The patient tolerated the procedure well and was transported to  the surgical intensive care unit in stable condition.  There were no intraoperative complications.  All sponge, needle and instrument counts were verified correct at the completion of the operation.  No autologous blood products  were administered. DD:  07/31/00 TD:  07/31/00 Job: [id number]

## 2010-09-14 NOTE — Procedures (Signed)
[facility]  Patient:    Jerry [name], Jerry [name] Visit Number: [id number] MRN: [id number]          Service Type: Attending:  Fayrene Fearing L. Randa Evens, M.D. Dictated by:   Llana Aliment. Randa Evens, M.D. Proc. Date: 06/17/01   CC:         Delorse Lek, M.D.  Luis Abed, M.D. [facility]   Procedure Report  PROCEDURE:  Colonoscopy.  MEDICATIONS:  Fentanyl 75 mcg, Versed 9 mg IV.  SCOPE:  Olympus pediatric video colonoscope.  INDICATION:  Hematochezia in a 77 year old.  DESCRIPTION OF PROCEDURE:  The procedure had been explained to the patient and consent obtained.  The patient in the left lateral decubitus position, the Olympus pediatric video colonoscope was inserted and advanced under direct visualization.  The prep was excellent.  We were able to easily advance to the cecum.  The ileocecal valve and appendiceal orifice were seen.  The scope was withdrawn, and the cecum, ascending colon, hepatic flexure, transverse colon, splenic flexure, descending and sigmoid colon were seen well upon removal.  No polyps were seen, no significant diverticular disease.  The scope was withdrawn, the colon carefully examined.  No other abnormalities were found. The scope was withdrawn into the rectum and the rectum carefully examined. The only significant finding was rather large internal hemorrhoids in the anal canal.  The patient tolerated the procedure well, was maintained on low-flow oxygen and pulse oximetry throughout the procedure.  ASSESSMENT:  Internal hemorrhoids, probably the source of his hematochezia.  PLAN:  We will go ahead and give him a hemorrhoid sheet, suggest fiber supplements and see back in the office in 2-3 months. Dictated by:   Llana Aliment. Randa Evens, M.D. Attending:  Llana Aliment. Randa Evens, M.D. DD:  06/17/01 TD:  06/17/01 Job: [id number]

## 2010-09-14 NOTE — Cardiovascular Report (Signed)
[place]. [facility]  Patient:    [name], Jerry [name]                         MRN: [id number] Proc. Date: 07/23/00 Adm. Date:  [id number] Disc. Date: [id number] Attending:  Daisey Must CC:         Delorse Lek, M.D.  Luis Abed, M.D. [facility]  Cardiac Catheterization Laboratory   Cardiac Catheterization  PROCEDURES PERFORMED:  Left heart catheterization with coronary angiography, left ventriculography, and abdominal aortography.  INDICATIONS:  Mr. Hiller is a 77 year old male, with a history of coronary artery disease with previous angioplasty to the right coronary artery.  He has been having progressive exertional angina and had an abnormal Cardiolite.  He was referred for cardiac catheterization.  DESCRIPTION OF PROCEDURE:  A 6 French sheath was placed in the right femoral artery.  Standard Judkins 6 French catheters were utilized.  Contrast was Omnipaque.  There were no complications.  RESULTS:  HEMODYNAMICS:  Left ventricular pressure 140/18.  Aortic pressure 138/66. There was no aortic valve gradient.  LEFT VENTRICULOGRAM:  There is mild akinesis of the inferior base, otherwise, normal wall motion.  Ejection fraction is estimated at 60%.  No mitral regurgitation.  ABDOMINAL AORTOGRAM:  Abdominal aortogram reveals patent abdominal aorta and renal arteries without significant disease.  CORONARY ARTERIOGRAPHY:  (Right dominant).  Left main:  Left main has a mid to distal 20% stenosis.  Left anterior descending:  The left anterior descending artery has a proximal 80% stenosis and a mid 60% stenosis.  The distal vessel has a diffuse 30% stenosis.  The LAD gives rise to two small diagonal branches.  Left circumflex:  The left circumflex has an 80% stenosis in the proximal vessel on a bend followed by an 80% stenosis in the mid vessel on a 90% degree bend.  In the distal vessel just after the first obtuse marginal branch is a 40% stenosis.   There is a large ramus intermediate which has a 30% stenosis. The first obtuse marginal is large and arises after the second 80% stenosis. This obtuse marginal had a 30% stenosis at its origin.  The second obtuse marginal is also large.  Right coronary artery:  The right coronary artery is a dominant vessel.  There is a 60% stenosis in the proximal vessel followed by an 80% stenosis.  The distal vessel has a diffuse 70-80% stenosis followed by 100% occlusion.  The distal right coronary artery, including a posterior descending artery and posterolateral branch fill via left to right collaterals.  IMPRESSION: 1. Preserved left ventricular systolic function. 2. Three-vessel coronary artery disease.  PLAN:  The patient will be referred for coronary artery bypass surgery. DD:  07/23/00 TD:  07/24/00 Job: [id number]

## 2010-09-14 NOTE — Assessment & Plan Note (Signed)
[facility]                              CARDIOLOGY OFFICE NOTE   NAME:[name], Jerry FOMBY                         MRN:          [id number]  DATE:12/24/2005                            DOB:          Jun 29, 1929    Mr. Livolsi is doing well.  He underwent CABG in 2002.  He has not had any  chest pain or shortness of breath.  He goes about full activities.  He has  not had any testing for ischemia since that time.   We know that his HDL has been low.  We did try some Niaspan, and he did not  tolerate it.  He will continue on Crestor.  He has had an excellent LDL  response on Crestor low dose.   ALLERGIES:  No known drug allergies.   MEDICATIONS:  1. Metoprolol 25 mg t.i.d.  2. Crestor 10 mg.  3. Sulfapyridine 500 mg.  4. Aspirin 81 mg.  5. Multivitamin.  6. Ranitidine 300 mg b.i.d.   OTHER MEDICAL PROBLEMS:  See the list below.   REVIEW OF SYSTEMS:  He feels fine, and he is fully active, and his review of  systems is negative.   PHYSICAL EXAMINATION:  VITAL SIGNS:  Blood pressure 128/72, with a pulse of  54.  GENERAL:  The patient is oriented to person, time and place, and his affect  is normal.  LUNGS:  Clear.  Respiratory effort is not labored.  HEENT:  Reveals  hearing aid in his right ear.  He has no xanthelasma.  There is normal extraocular motion.  There are no carotid bruits.  There is  no jugular venous distention.  CARDIAC:  Exam reveals an S1 with an S2. There are no clicks or significant  murmurs.  ABDOMEN:  Soft.  There are no masses or bruits.  EXTREMITIES:  There is no peripheral edema.  There are no musculoskeletal  deformities.   EKG reveals no significant change, with nonspecific ST-T wave abnormalities.   PROBLEMS:  1. Coronary disease, post coronary artery bypass graft in 2002.  2. Good left ventricular function.  3. Limited postoperative atrial flutter, resolved.  4. Hiatal hernia and peptic ulcer disease.  5. History of  dermatitis herpetiformis.  6. Abnormal lipids.  He is getting good LDL control.  He does not tolerate      niacin.  I am not inclined to change his medicines any further.   Mr. Keysor is stable.  It is now 5 years since any type of physiologic  testing.  I feel it is appropriate to proceed with a stress Myoview scan,  and this will be arranged.  I explained the rationale to the patient, and he  agrees.                               Luis Abed, MD, [facility]    JDK/MedQ  DD:  12/24/2005  DT:  12/24/2005  Job #:  [id number]   cc:   Marjory Lies, MD

## 2010-12-05 ENCOUNTER — Encounter: Payer: Self-pay | Admitting: Cardiology

## 2010-12-23 ENCOUNTER — Other Ambulatory Visit: Payer: Self-pay | Admitting: Cardiology

## 2011-01-01 ENCOUNTER — Encounter: Payer: Self-pay | Admitting: Cardiology

## 2011-01-01 DIAGNOSIS — Z951 Presence of aortocoronary bypass graft: Secondary | ICD-10-CM | POA: Insufficient documentation

## 2011-01-01 DIAGNOSIS — I10 Essential (primary) hypertension: Secondary | ICD-10-CM | POA: Insufficient documentation

## 2011-01-01 DIAGNOSIS — I4892 Unspecified atrial flutter: Secondary | ICD-10-CM | POA: Insufficient documentation

## 2011-01-01 DIAGNOSIS — Z789 Other specified health status: Secondary | ICD-10-CM | POA: Insufficient documentation

## 2011-01-01 DIAGNOSIS — I739 Peripheral vascular disease, unspecified: Secondary | ICD-10-CM

## 2011-01-01 DIAGNOSIS — E785 Hyperlipidemia, unspecified: Secondary | ICD-10-CM | POA: Insufficient documentation

## 2011-01-01 DIAGNOSIS — K279 Peptic ulcer, site unspecified, unspecified as acute or chronic, without hemorrhage or perforation: Secondary | ICD-10-CM | POA: Insufficient documentation

## 2011-01-03 ENCOUNTER — Other Ambulatory Visit: Payer: Self-pay | Admitting: Family Medicine

## 2011-01-03 ENCOUNTER — Ambulatory Visit (INDEPENDENT_AMBULATORY_CARE_PROVIDER_SITE_OTHER): Payer: Medicare Other | Admitting: Cardiology

## 2011-01-03 ENCOUNTER — Encounter: Payer: Self-pay | Admitting: Cardiology

## 2011-01-03 DIAGNOSIS — D649 Anemia, unspecified: Secondary | ICD-10-CM

## 2011-01-03 DIAGNOSIS — R634 Abnormal weight loss: Secondary | ICD-10-CM | POA: Insufficient documentation

## 2011-01-03 DIAGNOSIS — I779 Disorder of arteries and arterioles, unspecified: Secondary | ICD-10-CM

## 2011-01-03 DIAGNOSIS — E785 Hyperlipidemia, unspecified: Secondary | ICD-10-CM

## 2011-01-03 DIAGNOSIS — I2510 Atherosclerotic heart disease of native coronary artery without angina pectoris: Secondary | ICD-10-CM

## 2011-01-03 DIAGNOSIS — I10 Essential (primary) hypertension: Secondary | ICD-10-CM

## 2011-01-03 NOTE — Patient Instructions (Addendum)
Your physician recommends that you return for lab work in: Monday morning 9/10 at 8:30am (fasting lipid and liver)  414.01  433.1  Your physician wants you to follow-up in: one year.  You will receive a reminder letter in the mail two months in advance. If you don't receive a letter, please call our office to schedule the follow-up appointment.  Your physician has requested that you have a carotid duplex. This test is an ultrasound of the carotid arteries in your neck. It looks at blood flow through these arteries that supply the brain with blood. Allow one hour for this exam. There are no restrictions or special instructions.

## 2011-01-03 NOTE — Assessment & Plan Note (Signed)
Coronary disease is stable.  He does not need further workup at this time.

## 2011-01-03 NOTE — Assessment & Plan Note (Signed)
He will need a fasting lipid profile and this will be arranged.

## 2011-01-03 NOTE — Assessment & Plan Note (Signed)
Blood pressure is controlled. No change in therapy. 

## 2011-01-03 NOTE — Assessment & Plan Note (Signed)
He will have followup carotid Doppler.

## 2011-01-03 NOTE — Assessment & Plan Note (Signed)
The patient tells me of his weight loss.  This is being assessed by his primary physician.  I will see him for cardiology followup in one year.

## 2011-01-03 NOTE — Progress Notes (Signed)
HPI Patient is seen today for followup coronary artery disease.  I saw him last January 02, 2010.  He's not having any chest pain or shortness of breath.  He has been losing some weight and he is seeing his primary physician.  After I saw him last year, there was question of a carotid bruit.  Carotid Dopplers revealed 40-59% bilateral stenoses.  He should be scheduled soon to have a one-year followup.  He has not had any seizure symptoms.  As part of today's evaluation I have reviewed his old records and I have completely Updated the New EMR. Not on File  Current Outpatient Prescriptions  Medication Sig Dispense Refill  . amLODipine (NORVASC) 10 MG tablet TAKE ONE TABLET BY MOUTH EVERY DAY  30 tablet  12  . aspirin 81 MG tablet Take 81 mg by mouth daily.        . Fish Oil OIL Take 3,000 mg by mouth daily.        Marland Kitchen gabapentin (NEURONTIN) 600 MG tablet Take 600 mg by mouth 3 (three) times daily.        . metoprolol (LOPRESSOR) 50 MG tablet Take 25 mg by mouth 3 (three) times daily.        . Misc Natural Products (OSTEO BI-FLEX ADV JOINT SHIELD PO) Take by mouth 2 (two) times daily.        . multivitamin (THERAGRAN) per tablet Take 1 tablet by mouth daily.        . ranitidine (ZANTAC) 300 MG capsule Take 300 mg by mouth daily.        . simvastatin (ZOCOR) 20 MG tablet Take 20 mg by mouth at bedtime.        . Sulfapyridine POWD Take 250 mg by mouth daily.          History   Social History  . Marital Status: Widowed    Spouse Name: N/A    Number of Children: N/A  . Years of Education: N/A   Occupational History  . Not on file.   Social History Main Topics  . Smoking status: Former Games developer  . Smokeless tobacco: Not on file  . Alcohol Use: Not on file  . Drug Use: Not on file  . Sexually Active: Not on file   Other Topics Concern  . Not on file   Social History Narrative  . No narrative on file    No family history on file.  Past Medical History  Diagnosis Date  .  Dyslipidemia   . Medication intolerance     NIACIN  . CAD (coronary artery disease)     no ischemia nuclear scan 12/2005 EF60%  . Atrial flutter     limited post CABG  . Hiatal hernia   . Peptic ulcer disease   . Hypertension   . Hx of CABG     2002  . Ejection fraction     EF. 60%, Nuclear, 2007 ( no echo recently) one  . Carotid artery disease     Doppler, October, 2011, 40-59% bilateral    Past Surgical History  Procedure Date  . Coronary artery bypass graft 2002    ROS  Patient denies fever, chills, headache, sweats, rash, change in vision, change in hearing, chest pain, cough, nausea vomiting, urinary symptoms.  All other systems are reviewed and are negative.   PHYSICAL EXAM Patient is stable.  He is oriented to person time and place.  Affect is normal.  Head is atraumatic.  There  is no xanthelasma.   There is a soft left carotid bruit.  Lungs are clear.  Respiratory effort is nonlabored.  Cardiac exam reveals S1-S2.  No clicks or significant murmurs.  The abdomen is soft.  There is no peripheral edema.  There are no musculoskeletal deformities.  No skin rashes. Filed Vitals:   01/03/11 0921  BP: 132/60  Pulse: 60  Height: 6\' 1"  (1.854 m)  Weight: 178 lb (80.74 kg)    EKG is done today and reviewed by me there is normal sinus rhythm.  There is slight decrease in anterior R wave progression.  There is no significant change.  There are mild nonspecific ST-T wave changes.  ASSESSMENT & PLAN

## 2011-01-07 ENCOUNTER — Other Ambulatory Visit: Payer: Self-pay

## 2011-01-07 ENCOUNTER — Other Ambulatory Visit (INDEPENDENT_AMBULATORY_CARE_PROVIDER_SITE_OTHER): Payer: Medicare Other | Admitting: *Deleted

## 2011-01-07 ENCOUNTER — Ambulatory Visit
Admission: RE | Admit: 2011-01-07 | Discharge: 2011-01-07 | Disposition: A | Discharge status: Home / Self Care | Payer: Medicare Other | Source: Ambulatory Visit | Attending: Family Medicine | Admitting: Family Medicine

## 2011-01-07 DIAGNOSIS — R634 Abnormal weight loss: Secondary | ICD-10-CM

## 2011-01-07 DIAGNOSIS — I2510 Atherosclerotic heart disease of native coronary artery without angina pectoris: Secondary | ICD-10-CM

## 2011-01-07 DIAGNOSIS — D649 Anemia, unspecified: Secondary | ICD-10-CM

## 2011-01-07 DIAGNOSIS — E785 Hyperlipidemia, unspecified: Secondary | ICD-10-CM

## 2011-01-07 LAB — LIPID PANEL
HDL: 43.5 mg/dL (ref >39.00)
LDL Cholesterol: 73 mg/dL (ref 0–99)
Total CHOL/HDL Ratio: 3

## 2011-01-07 LAB — HEPATIC FUNCTION PANEL
Alkaline Phosphatase: 59 U/L (ref 39–117)
Bilirubin, Direct: 0.2 mg/dL (ref 0.0–0.3)
Total Protein: 6.9 g/dL (ref 6.0–8.3)

## 2011-01-10 NOTE — Progress Notes (Signed)
Pt was notified of test results and recommendations.

## 2011-01-18 ENCOUNTER — Other Ambulatory Visit: Payer: Self-pay | Admitting: Cardiology

## 2011-01-18 DIAGNOSIS — I6529 Occlusion and stenosis of unspecified carotid artery: Secondary | ICD-10-CM

## 2011-01-22 ENCOUNTER — Encounter (INDEPENDENT_AMBULATORY_CARE_PROVIDER_SITE_OTHER): Payer: Medicare Other | Admitting: *Deleted

## 2011-01-22 DIAGNOSIS — I6529 Occlusion and stenosis of unspecified carotid artery: Secondary | ICD-10-CM

## 2011-01-24 ENCOUNTER — Encounter: Payer: Self-pay | Admitting: Cardiology

## 2011-01-24 NOTE — Progress Notes (Signed)
I have received information requesting cardiac clearance for the patient to have a total knee replacement by Dr. Lequita Halt on May 13, 2011.  I saw the patient last in the office January 03, 2011.  He was stable at that time.  My records reveal that his last nuclear scan was in 2007.  The patient has known coronary disease post CABG in the past.  It has been greater than 5 years since his last study.  Therefore we will arrange a nuclear stress study as far as clearing him for his knee surgery.

## 2011-01-30 ENCOUNTER — Other Ambulatory Visit: Payer: Self-pay | Admitting: *Deleted

## 2011-01-30 DIAGNOSIS — Z01818 Encounter for other preprocedural examination: Secondary | ICD-10-CM

## 2011-02-06 ENCOUNTER — Ambulatory Visit (HOSPITAL_COMMUNITY): Payer: Medicare Other | Attending: Cardiology | Admitting: Radiology

## 2011-02-06 VITALS — Ht 73.0 in | Wt 179.0 lb

## 2011-02-06 DIAGNOSIS — Z01810 Encounter for preprocedural cardiovascular examination: Secondary | ICD-10-CM | POA: Insufficient documentation

## 2011-02-06 DIAGNOSIS — I491 Atrial premature depolarization: Secondary | ICD-10-CM

## 2011-02-06 DIAGNOSIS — I4949 Other premature depolarization: Secondary | ICD-10-CM

## 2011-02-06 DIAGNOSIS — I25810 Atherosclerosis of coronary artery bypass graft(s) without angina pectoris: Secondary | ICD-10-CM

## 2011-02-06 DIAGNOSIS — I2510 Atherosclerotic heart disease of native coronary artery without angina pectoris: Secondary | ICD-10-CM | POA: Insufficient documentation

## 2011-02-06 DIAGNOSIS — Z01818 Encounter for other preprocedural examination: Secondary | ICD-10-CM

## 2011-02-06 MED ORDER — TECHNETIUM TC 99M TETROFOSMIN IV KIT
11.0000 | PACK | Freq: Once | INTRAVENOUS | Status: AC | PRN
  Administered 2011-02-06: 11 via INTRAVENOUS

## 2011-02-06 MED ORDER — TECHNETIUM TC 99M TETROFOSMIN IV KIT
33.0000 | PACK | Freq: Once | INTRAVENOUS | Status: AC | PRN
  Administered 2011-02-06: 33 via INTRAVENOUS

## 2011-02-06 MED ORDER — REGADENOSON 0.4 MG/5ML IV SOLN
0.4000 mg | Freq: Once | INTRAVENOUS | Status: AC
  Administered 2011-02-06: 0.4 mg via INTRAVENOUS

## 2011-02-06 NOTE — Progress Notes (Signed)
[facility] SITE 3 NUCLEAR MED [address] [place] [address] [phone]  Cardiology Nuclear Med Study  Jerry [name] is a 77 y.o. male [id number] 06/07/1929   Nuclear Med Background Indication for Stress Test:  Evaluation for Ischemia, Surgical Clearance-pending TKR, Graft Patency and Abnormal EKG History: 03/02 Angioplasty, 03/02 CABG, 03/02 Heart Catheterization: EF 60%, '96 Myocardial Infarction and '07 Myocardial Perfusion Study:EF 60% (-) ischemia Cardiac Risk Factors: Carotid Disease-bilat., History of Smoking, Hypertension and Lipids  Symptoms:  Palpitations   Nuclear Pre-Procedure Caffeine/Decaff Intake:  None NPO After: 7:00am   Lungs:  clear IV 0.9% NS with Angio Cath:  22g  IV Site: R Hand  IV Started by:  Doyne Keel, CNMT  Chest Size (in):  44in Cup Size: n/a  Height: 6\' 1"  (1.854 m)  Weight:  179 lb (81.194 kg)  BMI:  Body mass index is 23.62 kg/(m^2). Tech Comments:  Took meds this am per patient    Nuclear Med Study 1 or 2 day study: 1 day  Stress Test Type:  Lexiscan  Reading MD: Marca Ancona, MD  Order Authorizing Provider:  Lovena Neighbours, MD  Resting Radionuclide: Technetium 73m Tetrofosmin  Resting Radionuclide Dose: 11.0 mCi   Stress Radionuclide:  Technetium 38m Tetrofosmin  Stress Radionuclide Dose: 33.0 mCi           Stress Protocol Rest HR: 81 Stress HR: 97  Rest BP: 137/60 Stress BP: 152/67  Exercise Time (min): n/a METS: n/a   Predicted Max HR: 141 bpm % Max HR: 68.79 bpm Rate Pressure Product: 46962   Dose of Adenosine (mg):  n/a Dose of Lexiscan: 0.4 mg  Dose of Atropine (mg): n/a Dose of Dobutamine: n/a mcg/kg/min (at max HR)  Stress Test Technologist: Milana Na, EMT-P  Nuclear Technologist:  Domenic Polite, CNMT     Rest Procedure:  Myocardial perfusion imaging was performed at rest 45 minutes following the intravenous administration of Technetium 69m Tetrofosmin. Rest ECG: SR, PVCS/PACS  Stress  Procedure:  The patient received IV Lexiscan 0.4 mg over 15-seconds.  Technetium 7m Tetrofosmin injected at 30-seconds.  There were no significant changes, Lt. Headed, and freq pvcs/pacs with Lexiscan.  Quantitative spect images were obtained after a 45 minute delay. Stress ECG: No significant change from baseline ECG  QPS Raw Data Images:  Normal; no motion artifact; normal heart/lung ratio. Stress Images:  Normal homogeneous uptake in all areas of the myocardium. Rest Images:  Normal homogeneous uptake in all areas of the myocardium. Subtraction (SDS):  There is no evidence of scar or ischemia. Transient Ischemic Dilatation (Normal <1.22):  1.06 Lung/Heart Ratio (Normal <0.45):  0.24  Quantitative Gated Spect Images QGS EDV:  114 ml QGS ESV:  42 ml QGS cine images:  NL LV Function; NL Wall Motion QGS EF: 63%  Impression Exercise Capacity:  Lexiscan with no exercise. BP Response:  Normal blood pressure response. Clinical Symptoms:  Lightheaded ECG Impression:  No significant ST segment change suggestive of ischemia.  PACs and PVCs.  Comparison with Prior Nuclear Study: No significant change from previous study  Overall Impression:  Normal stress nuclear study. Jerry [name] [name]

## 2011-02-07 ENCOUNTER — Encounter: Payer: Self-pay | Admitting: Cardiology

## 2011-02-07 DIAGNOSIS — I2510 Atherosclerotic heart disease of native coronary artery without angina pectoris: Secondary | ICD-10-CM | POA: Insufficient documentation

## 2011-02-07 DIAGNOSIS — R9430 Abnormal result of cardiovascular function study, unspecified: Secondary | ICD-10-CM | POA: Insufficient documentation

## 2011-02-07 NOTE — Progress Notes (Signed)
The patient had been seen on January 03, 2011.  He was stable.  I received a request to clear him for knee surgery in January, 2013.  I felt that a nuclear stress study should be done.  This was done on February 06, 2011.  There was no ischemia.  The ejection fraction was normal.  He does not need any further workup.  The patient is clear for his orthopedic surgery in January, 2013

## 2011-02-23 ENCOUNTER — Other Ambulatory Visit: Payer: Self-pay | Admitting: Cardiology

## 2011-04-12 ENCOUNTER — Other Ambulatory Visit: Payer: Self-pay | Admitting: Orthopedic Surgery

## 2011-04-12 NOTE — H&P (Signed)
Jerry [name]  DOB: Dec 06, 1929  Date of Admission:  05/13/2011  Chief Complaint:  Right Knee Pain  History of Present Illness The patient is a 77 year old male who comes in today for a preoperative History and Physical. The patient is scheduled for a right total knee arthroplasty to be performed by Dr. Gus Rankin. Aluisio, MD at [facility] on May 12, 2010. They have been treated conservatively in the past for the above stated problem and despite conservative measures, they continue to have progressive pain and severe functional limitations and dysfunction. They have failed non-operative management including home exercise, medications, and injections. It is felt that they would benefit from undergoing total joint replacement. Risks and benefits of the procedure have been discussed with the patient and they elect to proceed with surgery. There are no active contraindications to surgery such as ongoing infection or rapidly progressive neurological disease.  Allergies No Known Drug Allergies. 12/07/2010  Medication History AmLODIPine Besylate (10MG  Tablet, Oral) Active. Aspirin EC Low Strength (81MG  Tablet DR, Oral) Active. Simvastatin (20MG  Tablet, Oral) Active. Metoprolol Tartrate (50MG  Tablet, Oral) Active. Ranitidine HCl (300MG  Tablet, Oral) Active. Gabapentin (600MG  Tablet, Oral) Active. Tamsulosin HCl (0.4MG  Capsule, Oral) Active.  Problem List/Past Medical Coronary artery disease Hypertension Hypercholesterolemia Myocardial infarction. 1996 Osteoarthritis Impaired Hearing Pneumonia. Past history Hiatal Hernia Gastroesophageal Reflux Disease Gastric Ulcer Hemorrhoids Dermatitis. Dermatitis herpetiformis Measles Mumps  Past Surgical History Coronary Artery Bypass Graft. Date: 2002. 5 vessels Inguinal Hernia Repair. Date: 25. open: right Total Knee Replacement. Date: 08/28/2009. left Hand Surgery. Date: 2009. Colonoscopy  Family  History Cerebrovascular Accident. mother and father Hypertension. father Osteoarthritis. father Rheumatoid Arthritis. father Father. Deceased. age 24 Mother. Deceased, Cerebrovascular Accident. age 20  Social History Alcohol use. former drinker Children. 2 Current work status. retired Financial planner (Currently). no Drug/Alcohol Rehab (Previously). no Exercise. Exercises rarely; does running / walking Illicit drug use. no Living situation. live alone Marital status. widowed Pain Contract. no Tobacco / smoke exposure. no Tobacco use. former smoker; smoke(d) less than 1/2 pack(s) per day Post-Surgical Plans. Plans to look into [facility] postoperatively. Advance Directives. Living Will and Healthcare POA  Review of Systems General:Not Present- Chills, Fever, Night Sweats, Appetite Loss, Fatigue, Feeling sick, Weight Gain and Weight Loss. Skin:Not Present- Itching, Rash, Skin Color Changes, Ulcer, Psoriasis and Change in Hair or Nails. HEENT:Present- Ringing in the Ears. Not Present- Sensitivity to light, Hearing problems and Nose Bleed. Neck:Not Present- Swollen Glands and Neck Mass. Respiratory:Not Present- Snoring, Chronic Cough, Bloody sputum and Dyspnea. Cardiovascular:Not Present- Shortness of Breath, Chest Pain, Swelling of Extremities, Leg Cramps and Palpitations. Gastrointestinal:Not Present- Bloody Stool, Heartburn, Abdominal Pain, Vomiting, Nausea and Incontinence of Stool. Male Genitourinary:Present- Nocturia. Not Present- Blood in Urine, Frequency and Incontinence. Musculoskeletal:Present- Joint Swelling and Joint Pain. Not Present- Muscle Weakness, Muscle Pain, Joint Stiffness and Back Pain. Neurological:Not Present- Tingling, Numbness, Burning, Tremor, Headaches and Dizziness. Psychiatric:Not Present- Anxiety, Depression and Memory Loss. Endocrine:Not Present- Cold Intolerance, Heat Intolerance, Excessive hunger and Excessive  Thirst. Hematology:Not Present- Abnormal Bleeding, Anemia, Blood Clots and Easy Bruising.  Vitals Weight: 185 lb Height: 73 in Body Surface Area: 2.08 m Body Mass Index: 24.41 kg/m Pulse: 76 (Regular) Resp.: 16 (Unlabored) BP: 152/68 (Sitting, Right Arm, Standard)  Physical Exam The physical exam findings are as follows: Patient is a 77 year old white male seen for ongoing right knee pain.  General Mental Status - Alert, cooperative and good historian. General Appearance- pleasant. Not in acute distress.  Orientation- Oriented X3. Build & Nutrition- Well nourished and Well developed.  Head and Neck Head- normocephalic, atraumatic . Neck Global Assessment- supple. no bruit auscultated on the right and no bruit auscultated on the left.  Eye Pupil- Bilateral- Regular and Round. Motion- Bilateral- EOMI. Patient does wear glasses. EarsNote: Patient has bilateral hearing aides. Mouth and ThroatNote: Patient does have upper and lowwer denture plates.  Chest and Lung Exam Auscultation: Breath sounds:- clear at anterior chest wall and - clear at posterior chest wall. Adventitious sounds:- No Adventitious sounds.  Cardiovascular Auscultation:Rhythm- Regular rate and rhythm. Heart Sounds- S1 WNL and S2 WNL. Murmurs & Other Heart Sounds:Auscultation of the heart reveals - No Murmurs.  Abdomen Palpation/Percussion:Tenderness- Abdomen is non-tender to palpation. Rigidity (guarding)- Abdomen is soft. Auscultation:Auscultation of the abdomen reveals - Bowel sounds normal.   Male Genitourinary Not done, not pertinent to present illness  Musculoskeletal  Right knee shows no effusion. There is significant varus deformity right knee. He has tenderness medial greater than latera. Range 5 to 120. There is no instability noted.  RADIOGRAPHS: Review of his radiographs demonstrate that he has advanced end stage arthritis of the right knee, bone on  bone in the medial and patellofemoral compartments.  Assessment/Plan Osteoarthritis Right Knee  Patient is for a Right Total Knee Replacement by Dr. Lequita Halt.  Patient wants to look into [facility] after the hospital stay which is where he went following his previous left total knee.  After reviewing his previous hospitalization, it is of note that the patient developed some altered mental status postop along with difficulty with his oxygen saturations following his previous left total knee done last year in May 2011.  Avel Peace, PA-C

## 2011-05-03 ENCOUNTER — Encounter (HOSPITAL_COMMUNITY): Payer: Self-pay

## 2011-05-03 ENCOUNTER — Encounter (HOSPITAL_COMMUNITY)
Admission: RE | Admit: 2011-05-03 | Discharge: 2011-05-03 | Disposition: A | Discharge status: Home / Self Care | Payer: Medicare Other | Source: Ambulatory Visit | Attending: Orthopedic Surgery | Admitting: Orthopedic Surgery

## 2011-05-03 HISTORY — DX: Acute myocardial infarction, unspecified: I21.9

## 2011-05-03 HISTORY — DX: Presence of external hearing-aid: Z97.4

## 2011-05-03 HISTORY — DX: Unspecified osteoarthritis, unspecified site: M19.90

## 2011-05-03 LAB — COMPREHENSIVE METABOLIC PANEL
AST: 17 U/L (ref 0–37)
Albumin: 3.5 g/dL (ref 3.5–5.2)
CO2: 29 mEq/L (ref 19–32)
Calcium: 9.4 mg/dL (ref 8.4–10.5)
Creatinine, Ser: 0.83 mg/dL (ref 0.50–1.35)
GFR calc non Af Amer: 82 mL/min — ABNORMAL LOW (ref >90)
Sodium: 139 mEq/L (ref 135–145)
Total Protein: 7.8 g/dL (ref 6.0–8.3)

## 2011-05-03 LAB — SURGICAL PCR SCREEN: MRSA, PCR: NEGATIVE

## 2011-05-03 LAB — URINALYSIS, ROUTINE W REFLEX MICROSCOPIC
Ketones, ur: NEGATIVE mg/dL
Leukocytes, UA: NEGATIVE
Nitrite: NEGATIVE
pH: 7.5 (ref 5.0–8.0)

## 2011-05-03 LAB — CBC
MCH: 26.9 pg (ref 26.0–34.0)
MCHC: 32.1 g/dL (ref 30.0–36.0)
MCV: 83.6 fL (ref 78.0–100.0)
Platelets: 258 10*3/uL (ref 150–400)
RDW: 15 % (ref 11.5–15.5)

## 2011-05-03 LAB — PROTIME-INR: INR: 1.05 (ref 0.00–1.49)

## 2011-05-03 NOTE — Patient Instructions (Addendum)
20 OSHAE SIMMERING  05/03/2011   Your procedure is scheduled on:  05/13/11 [id number] am   Report to [facility] at [phone] AM.  Call this number if you have problems the morning of surgery: [phone]   Remember:   Do not eat food:After Midnight.  May have clear liquids:until Midnight .  Clear liquids include soda, tea, black coffee, apple or grape juice, broth.  Take these medicines the morning of surgery with A SIP OF WATER:    Do not wear jewelry,   Do not wear lotions, powders, or perfumes.    Do not bring valuables to the hospital.  Contacts, dentures or bridgework may not be worn into surgery.  Leave suitcase in the car. After surgery it may be brought to your room.  For patients admitted to the hospital, checkout time is 11:00 AM the day of discharge.       Special Instructions: CHG Shower Use Special Wash: 1/2 bottle night before surgery and 1/2 bottle morning of surgery.  Shower chin to toes with CHG.  Wash face and private parts with reguilar soap.     Please read over the following fact sheets that you were given: MRSA Information, coughing and deep breathing exercises, leg exercises, Incentive Spirometry  Fact sheet, Blood Transfusion Fact Sheet

## 2011-05-03 NOTE — Pre-Procedure Instructions (Signed)
Stress test done 02/06/11 in [facility]

## 2011-05-03 NOTE — Pre-Procedure Instructions (Signed)
01/03/11 LOV with Dr Myrtis Ser in [facility]

## 2011-05-12 MED ORDER — BUPIVACAINE 0.25 % ON-Q PUMP SINGLE CATH 300ML
300.0000 mL | INJECTION | Status: DC
  Administered 2011-05-13: 300 mL
  Filled 2011-05-12: qty 300

## 2011-05-13 ENCOUNTER — Encounter (HOSPITAL_COMMUNITY)
Admission: RE | Disposition: A | Discharge status: Skilled Nursing Facility | Payer: Self-pay | Source: Ambulatory Visit | Attending: Orthopedic Surgery

## 2011-05-13 ENCOUNTER — Inpatient Hospital Stay (HOSPITAL_COMMUNITY)
Admission: RE | Admit: 2011-05-13 | Discharge: 2011-05-16 | DRG: 470 | Disposition: A | Discharge status: Skilled Nursing Facility | Payer: Medicare Other | Source: Ambulatory Visit | Attending: Orthopedic Surgery | Admitting: Orthopedic Surgery

## 2011-05-13 ENCOUNTER — Encounter (HOSPITAL_COMMUNITY): Payer: Self-pay

## 2011-05-13 ENCOUNTER — Inpatient Hospital Stay (HOSPITAL_COMMUNITY): Payer: Medicare Other | Admitting: Registered Nurse

## 2011-05-13 ENCOUNTER — Encounter (HOSPITAL_COMMUNITY): Payer: Self-pay | Admitting: Registered Nurse

## 2011-05-13 DIAGNOSIS — Z01812 Encounter for preprocedural laboratory examination: Secondary | ICD-10-CM

## 2011-05-13 DIAGNOSIS — Z96659 Presence of unspecified artificial knee joint: Secondary | ICD-10-CM

## 2011-05-13 DIAGNOSIS — M1710 Unilateral primary osteoarthritis, unspecified knee: Principal | ICD-10-CM | POA: Diagnosis present

## 2011-05-13 DIAGNOSIS — I2510 Atherosclerotic heart disease of native coronary artery without angina pectoris: Secondary | ICD-10-CM | POA: Diagnosis present

## 2011-05-13 DIAGNOSIS — Z951 Presence of aortocoronary bypass graft: Secondary | ICD-10-CM

## 2011-05-13 DIAGNOSIS — E871 Hypo-osmolality and hyponatremia: Secondary | ICD-10-CM | POA: Diagnosis not present

## 2011-05-13 DIAGNOSIS — Z9289 Personal history of other medical treatment: Secondary | ICD-10-CM

## 2011-05-13 DIAGNOSIS — D62 Acute posthemorrhagic anemia: Secondary | ICD-10-CM | POA: Diagnosis not present

## 2011-05-13 HISTORY — PX: TOTAL KNEE ARTHROPLASTY: SHX125

## 2011-05-13 LAB — TYPE AND SCREEN
ABO/RH(D): O POS
Unit division: 0

## 2011-05-13 SURGERY — ARTHROPLASTY, KNEE, TOTAL
Anesthesia: General | Site: Knee | Laterality: Right | Wound class: Clean

## 2011-05-13 MED ORDER — LIDOCAINE HCL (CARDIAC) 20 MG/ML IV SOLN
INTRAVENOUS | Status: DC | PRN
  Administered 2011-05-13: 75 mg via INTRAVENOUS

## 2011-05-13 MED ORDER — ROCURONIUM BROMIDE 100 MG/10ML IV SOLN
INTRAVENOUS | Status: DC | PRN
  Administered 2011-05-13: 50 mg via INTRAVENOUS

## 2011-05-13 MED ORDER — CEFAZOLIN SODIUM 1-5 GM-% IV SOLN
1.0000 g | Freq: Four times a day (QID) | INTRAVENOUS | Status: AC
  Administered 2011-05-13 – 2011-05-14 (×3): 1 g via INTRAVENOUS
  Filled 2011-05-13 (×3): qty 50

## 2011-05-13 MED ORDER — ACETAMINOPHEN 650 MG RE SUPP: 650.0000 mg | Freq: Four times a day (QID) | RECTAL | Status: DC | PRN

## 2011-05-13 MED ORDER — GABAPENTIN 300 MG PO CAPS
600.0000 mg | ORAL_CAPSULE | Freq: Three times a day (TID) | ORAL | Status: DC
  Administered 2011-05-13 – 2011-05-16 (×8): 600 mg via ORAL
  Filled 2011-05-13 (×10): qty 2

## 2011-05-13 MED ORDER — METHOCARBAMOL 500 MG PO TABS
500.0000 mg | ORAL_TABLET | Freq: Four times a day (QID) | ORAL | Status: DC | PRN
  Administered 2011-05-14 – 2011-05-15 (×3): 500 mg via ORAL
  Filled 2011-05-13 (×3): qty 1

## 2011-05-13 MED ORDER — GLYCOPYRROLATE 0.2 MG/ML IJ SOLN
INTRAMUSCULAR | Status: DC | PRN
  Administered 2011-05-13: .4 mg via INTRAVENOUS

## 2011-05-13 MED ORDER — CHLORHEXIDINE GLUCONATE 4 % EX LIQD: 60.0000 mL | Freq: Once | CUTANEOUS | Status: DC

## 2011-05-13 MED ORDER — ONDANSETRON HCL 4 MG/2ML IJ SOLN: 4.0000 mg | Freq: Four times a day (QID) | INTRAMUSCULAR | Status: DC | PRN

## 2011-05-13 MED ORDER — PHENOL 1.4 % MT LIQD
1.0000 | OROMUCOSAL | Status: DC | PRN
  Filled 2011-05-13: qty 177

## 2011-05-13 MED ORDER — DIPHENHYDRAMINE HCL 12.5 MG/5ML PO ELIX
12.5000 mg | ORAL_SOLUTION | Freq: Four times a day (QID) | ORAL | Status: DC | PRN
  Filled 2011-05-13: qty 5

## 2011-05-13 MED ORDER — CEFAZOLIN SODIUM-DEXTROSE 2-3 GM-% IV SOLR
2.0000 g | Freq: Once | INTRAVENOUS | Status: AC
  Administered 2011-05-13: 2 g via INTRAVENOUS

## 2011-05-13 MED ORDER — NALOXONE HCL 0.4 MG/ML IJ SOLN: 0.4000 mg | INTRAMUSCULAR | Status: DC | PRN

## 2011-05-13 MED ORDER — PROPOFOL 10 MG/ML IV EMUL
INTRAVENOUS | Status: DC | PRN
  Administered 2011-05-13: 50 mg via INTRAVENOUS
  Administered 2011-05-13: 30 mg via INTRAVENOUS
  Administered 2011-05-13: 20 mg via INTRAVENOUS
  Administered 2011-05-13: 150 mg via INTRAVENOUS

## 2011-05-13 MED ORDER — DIPHENHYDRAMINE HCL 50 MG/ML IJ SOLN: 12.5000 mg | Freq: Four times a day (QID) | INTRAMUSCULAR | Status: DC | PRN

## 2011-05-13 MED ORDER — HYDROMORPHONE HCL PF 1 MG/ML IJ SOLN
INTRAMUSCULAR | Status: DC | PRN
  Administered 2011-05-13 (×2): .5 mg via INTRAVENOUS

## 2011-05-13 MED ORDER — RIVAROXABAN 10 MG PO TABS
10.0000 mg | ORAL_TABLET | Freq: Every day | ORAL | Status: DC
  Administered 2011-05-14 – 2011-05-16 (×3): 10 mg via ORAL
  Filled 2011-05-13 (×3): qty 1

## 2011-05-13 MED ORDER — SODIUM CHLORIDE 0.9 % IJ SOLN: 9.0000 mL | INTRAMUSCULAR | Status: DC | PRN

## 2011-05-13 MED ORDER — LACTATED RINGERS IV SOLN
INTRAVENOUS | Status: DC
  Administered 2011-05-13: 1000 mL via INTRAVENOUS
  Administered 2011-05-13 (×2): via INTRAVENOUS

## 2011-05-13 MED ORDER — HYDROMORPHONE HCL PF 1 MG/ML IJ SOLN: 0.2500 mg | INTRAMUSCULAR | Status: DC | PRN

## 2011-05-13 MED ORDER — FLEET ENEMA 7-19 GM/118ML RE ENEM: 1.0000 | ENEMA | Freq: Once | RECTAL | Status: AC | PRN

## 2011-05-13 MED ORDER — DOCUSATE SODIUM 100 MG PO CAPS
100.0000 mg | ORAL_CAPSULE | Freq: Two times a day (BID) | ORAL | Status: DC
  Administered 2011-05-13 – 2011-05-16 (×6): 100 mg via ORAL
  Filled 2011-05-13 (×7): qty 1

## 2011-05-13 MED ORDER — MORPHINE SULFATE (PF) 1 MG/ML IV SOLN
INTRAVENOUS | Status: DC
  Administered 2011-05-13: 4 mg via INTRAVENOUS
  Administered 2011-05-13: 3 mg via INTRAVENOUS
  Administered 2011-05-13: 13 mg via INTRAVENOUS
  Administered 2011-05-13: 11:00:00 via INTRAVENOUS
  Administered 2011-05-14: 2 mg via INTRAVENOUS
  Administered 2011-05-14: 11 mg via INTRAVENOUS
  Filled 2011-05-13 (×2): qty 25

## 2011-05-13 MED ORDER — ONDANSETRON HCL 4 MG PO TABS: 4.0000 mg | ORAL_TABLET | Freq: Four times a day (QID) | ORAL | Status: DC | PRN

## 2011-05-13 MED ORDER — METOCLOPRAMIDE HCL 10 MG PO TABS: 5.0000 mg | ORAL_TABLET | Freq: Three times a day (TID) | ORAL | Status: DC | PRN

## 2011-05-13 MED ORDER — ACETAMINOPHEN 10 MG/ML IV SOLN
1000.0000 mg | Freq: Four times a day (QID) | INTRAVENOUS | Status: AC
  Administered 2011-05-13 – 2011-05-14 (×4): 1000 mg via INTRAVENOUS
  Filled 2011-05-13 (×4): qty 100

## 2011-05-13 MED ORDER — POLYETHYLENE GLYCOL 3350 17 G PO PACK
17.0000 g | PACK | Freq: Every day | ORAL | Status: DC | PRN
  Filled 2011-05-13: qty 1

## 2011-05-13 MED ORDER — METOCLOPRAMIDE HCL 5 MG/ML IJ SOLN: 5.0000 mg | Freq: Three times a day (TID) | INTRAMUSCULAR | Status: DC | PRN

## 2011-05-13 MED ORDER — PROMETHAZINE HCL 25 MG/ML IJ SOLN: 6.2500 mg | INTRAMUSCULAR | Status: DC | PRN

## 2011-05-13 MED ORDER — NEOSTIGMINE METHYLSULFATE 1 MG/ML IJ SOLN
INTRAMUSCULAR | Status: DC | PRN
  Administered 2011-05-13: 3 mg via INTRAVENOUS

## 2011-05-13 MED ORDER — DIPHENHYDRAMINE HCL 12.5 MG/5ML PO ELIX
12.5000 mg | ORAL_SOLUTION | ORAL | Status: DC | PRN
  Administered 2011-05-14: 12.5 mg via ORAL
  Filled 2011-05-13: qty 5

## 2011-05-13 MED ORDER — SULFAPYRIDINE POWD
250.0000 mg | Freq: Every day | Status: DC
  Administered 2011-05-14 – 2011-05-15 (×2): 250 mg via ORAL

## 2011-05-13 MED ORDER — METHOCARBAMOL 100 MG/ML IJ SOLN
500.0000 mg | Freq: Four times a day (QID) | INTRAVENOUS | Status: DC | PRN
  Administered 2011-05-13 – 2011-05-14 (×2): 500 mg via INTRAVENOUS
  Filled 2011-05-13 (×2): qty 5

## 2011-05-13 MED ORDER — BUPIVACAINE ON-Q PAIN PUMP (FOR ORDER SET NO CHG)
INJECTION | Status: DC
  Filled 2011-05-13: qty 1

## 2011-05-13 MED ORDER — ACETAMINOPHEN 10 MG/ML IV SOLN
INTRAVENOUS | Status: DC | PRN
  Administered 2011-05-13: 1000 mg via INTRAVENOUS

## 2011-05-13 MED ORDER — TAMSULOSIN HCL 0.4 MG PO CAPS
0.4000 mg | ORAL_CAPSULE | Freq: Every day | ORAL | Status: DC
  Administered 2011-05-13 – 2011-05-16 (×4): 0.4 mg via ORAL
  Filled 2011-05-13 (×4): qty 1

## 2011-05-13 MED ORDER — MENTHOL 3 MG MT LOZG
1.0000 | LOZENGE | OROMUCOSAL | Status: DC | PRN
  Filled 2011-05-13: qty 9

## 2011-05-13 MED ORDER — KCL IN DEXTROSE-NACL 20-5-0.9 MEQ/L-%-% IV SOLN
INTRAVENOUS | Status: DC
  Administered 2011-05-13 – 2011-05-14 (×2): via INTRAVENOUS
  Filled 2011-05-13 (×4): qty 1000

## 2011-05-13 MED ORDER — TEMAZEPAM 15 MG PO CAPS
15.0000 mg | ORAL_CAPSULE | Freq: Every evening | ORAL | Status: DC | PRN
  Administered 2011-05-14: 15 mg via ORAL
  Filled 2011-05-13: qty 1

## 2011-05-13 MED ORDER — SODIUM CHLORIDE 0.9 % IR SOLN
Status: DC | PRN
  Administered 2011-05-13: 1
  Administered 2011-05-13: 1000 mL

## 2011-05-13 MED ORDER — METOPROLOL TARTRATE 25 MG PO TABS
25.0000 mg | ORAL_TABLET | Freq: Three times a day (TID) | ORAL | Status: DC
  Administered 2011-05-13 – 2011-05-16 (×8): 25 mg via ORAL
  Filled 2011-05-13 (×10): qty 1

## 2011-05-13 MED ORDER — ONDANSETRON HCL 4 MG/2ML IJ SOLN
INTRAMUSCULAR | Status: DC | PRN
  Administered 2011-05-13: 4 mg via INTRAVENOUS

## 2011-05-13 MED ORDER — OXYCODONE HCL 5 MG PO TABS
5.0000 mg | ORAL_TABLET | ORAL | Status: DC | PRN
  Administered 2011-05-13: 5 mg via ORAL
  Filled 2011-05-13: qty 1

## 2011-05-13 MED ORDER — BISACODYL 10 MG RE SUPP: 10.0000 mg | Freq: Every day | RECTAL | Status: DC | PRN

## 2011-05-13 MED ORDER — FAMOTIDINE 20 MG PO TABS
20.0000 mg | ORAL_TABLET | Freq: Two times a day (BID) | ORAL | Status: DC
  Administered 2011-05-13 – 2011-05-16 (×6): 20 mg via ORAL
  Filled 2011-05-13 (×7): qty 1

## 2011-05-13 MED ORDER — SIMVASTATIN 20 MG PO TABS
20.0000 mg | ORAL_TABLET | Freq: Every day | ORAL | Status: DC
  Administered 2011-05-13 – 2011-05-15 (×3): 20 mg via ORAL
  Filled 2011-05-13 (×4): qty 1

## 2011-05-13 MED ORDER — GABAPENTIN 600 MG PO TABS
600.0000 mg | ORAL_TABLET | Freq: Three times a day (TID) | ORAL | Status: DC
Start: 2011-05-13 — End: 2011-05-13
  Administered 2011-05-13: 600 mg via ORAL
  Filled 2011-05-13 (×6): qty 1

## 2011-05-13 MED ORDER — FENTANYL CITRATE 0.05 MG/ML IJ SOLN
INTRAMUSCULAR | Status: DC | PRN
  Administered 2011-05-13 (×2): 50 ug via INTRAVENOUS
  Administered 2011-05-13 (×2): 100 ug via INTRAVENOUS
  Administered 2011-05-13: 50 ug via INTRAVENOUS

## 2011-05-13 MED ORDER — ACETAMINOPHEN 325 MG PO TABS: 650.0000 mg | ORAL_TABLET | Freq: Four times a day (QID) | ORAL | Status: DC | PRN

## 2011-05-13 MED ORDER — AMLODIPINE BESYLATE 10 MG PO TABS
10.0000 mg | ORAL_TABLET | Freq: Every day | ORAL | Status: DC
  Administered 2011-05-14 – 2011-05-16 (×3): 10 mg via ORAL
  Filled 2011-05-13 (×3): qty 1

## 2011-05-13 SURGICAL SUPPLY — 54 items
BAG SPEC THK2 15X12 ZIP CLS (MISCELLANEOUS) ×1
BAG ZIPLOCK 12X15 (MISCELLANEOUS) ×2 IMPLANT
BANDAGE ELASTIC 6 VELCRO ST LF (GAUZE/BANDAGES/DRESSINGS) ×2 IMPLANT
BANDAGE ESMARK 6X9 LF (GAUZE/BANDAGES/DRESSINGS) ×1 IMPLANT
BLADE SAG 18X100X1.27 (BLADE) ×2 IMPLANT
BLADE SAW SGTL 11.0X1.19X90.0M (BLADE) ×2 IMPLANT
BNDG CMPR 9X6 STRL LF SNTH (GAUZE/BANDAGES/DRESSINGS) ×1
BNDG ESMARK 6X9 LF (GAUZE/BANDAGES/DRESSINGS) ×2
BOWL SMART MIX CTS (DISPOSABLE) ×2 IMPLANT
CATH KIT ON-Q SILVERSOAK 5 (CATHETERS) ×1 IMPLANT
CATH KIT ON-Q SILVERSOAK 5IN (CATHETERS) ×2 IMPLANT
CEMENT HV SMART SET (Cement) ×4 IMPLANT
CLOTH BEACON ORANGE TIMEOUT ST (SAFETY) ×2 IMPLANT
CUFF TOURN SGL QUICK 34 (TOURNIQUET CUFF) ×2
CUFF TRNQT CYL 34X4X40X1 (TOURNIQUET CUFF) ×1 IMPLANT
DRAPE EXTREMITY T 121X128X90 (DRAPE) ×2 IMPLANT
DRAPE POUCH INSTRU U-SHP 10X18 (DRAPES) ×2 IMPLANT
DRAPE U-SHAPE 47X51 STRL (DRAPES) ×2 IMPLANT
DRSG ADAPTIC 3X8 NADH LF (GAUZE/BANDAGES/DRESSINGS) ×2 IMPLANT
DRSG PAD ABDOMINAL 8X10 ST (GAUZE/BANDAGES/DRESSINGS) ×1 IMPLANT
DURAPREP 26ML APPLICATOR (WOUND CARE) ×2 IMPLANT
ELECT REM PT RETURN 9FT ADLT (ELECTROSURGICAL) ×2
ELECTRODE REM PT RTRN 9FT ADLT (ELECTROSURGICAL) ×1 IMPLANT
EVACUATOR 1/8 PVC DRAIN (DRAIN) ×2 IMPLANT
FACESHIELD LNG OPTICON STERILE (SAFETY) ×10 IMPLANT
GLOVE BIO SURGEON STRL SZ7.5 (GLOVE) ×2 IMPLANT
GLOVE BIO SURGEON STRL SZ8 (GLOVE) ×2 IMPLANT
GLOVE BIOGEL PI IND STRL 8 (GLOVE) ×2 IMPLANT
GLOVE BIOGEL PI INDICATOR 8 (GLOVE) ×2
GOWN STRL NON-REIN LRG LVL3 (GOWN DISPOSABLE) ×2 IMPLANT
GOWN STRL REIN XL XLG (GOWN DISPOSABLE) ×2 IMPLANT
HANDPIECE INTERPULSE COAX TIP (DISPOSABLE) ×2
IMMOBILIZER KNEE 20 (SOFTGOODS) ×2
IMMOBILIZER KNEE 20 THIGH 36 (SOFTGOODS) ×1 IMPLANT
KIT BASIN OR (CUSTOM PROCEDURE TRAY) ×2 IMPLANT
MANIFOLD NEPTUNE II (INSTRUMENTS) ×2 IMPLANT
NS IRRIG 1000ML POUR BTL (IV SOLUTION) ×2 IMPLANT
PACK TOTAL JOINT (CUSTOM PROCEDURE TRAY) ×2 IMPLANT
PAD ABD 7.5X8 STRL (GAUZE/BANDAGES/DRESSINGS) ×2 IMPLANT
PADDING CAST COTTON 6X4 STRL (CAST SUPPLIES) ×6 IMPLANT
PADDING WEBRIL 6 STERILE (GAUZE/BANDAGES/DRESSINGS) ×1 IMPLANT
POSITIONER SURGICAL ARM (MISCELLANEOUS) ×2 IMPLANT
SET HNDPC FAN SPRY TIP SCT (DISPOSABLE) ×1 IMPLANT
SPONGE GAUZE 4X4 12PLY (GAUZE/BANDAGES/DRESSINGS) ×2 IMPLANT
STRIP CLOSURE SKIN 1/2X4 (GAUZE/BANDAGES/DRESSINGS) ×4 IMPLANT
SUCTION FRAZIER 12FR DISP (SUCTIONS) ×2 IMPLANT
SUT MNCRL AB 4-0 PS2 18 (SUTURE) ×2 IMPLANT
SUT PDS AB 1 CT1 27 (SUTURE) ×6 IMPLANT
SUT VIC AB 2-0 CT1 27 (SUTURE) ×6
SUT VIC AB 2-0 CT1 TAPERPNT 27 (SUTURE) ×3 IMPLANT
TOWEL OR 17X26 10 PK STRL BLUE (TOWEL DISPOSABLE) ×4 IMPLANT
TRAY FOLEY CATH 14FRSI W/METER (CATHETERS) ×2 IMPLANT
WATER STERILE IRR 1500ML POUR (IV SOLUTION) ×2 IMPLANT
WRAP KNEE MAXI GEL POST OP (GAUZE/BANDAGES/DRESSINGS) ×4 IMPLANT

## 2011-05-13 NOTE — Anesthesia Preprocedure Evaluation (Addendum)
Anesthesia Evaluation  Patient identified by MRN, date of birth, ID band Patient awake    Reviewed: Allergy & Precautions, H&P , NPO status , Patient's Chart, lab work & pertinent test results, reviewed documented beta blocker date and time   Airway Mallampati: II TM Distance: >3 FB Neck ROM: Full    Dental  (+) Upper Dentures and Lower Dentures   Pulmonary neg pulmonary ROS,  clear to auscultation        Cardiovascular hypertension, Pt. on medications + CAD and + Past MI Regular Normal CAD, CABG '99, currently asymptomatic Given CV clearance Pt denies hx of arrhythmias Rt Carotid Dz-followed q 6 months, no symptoms   Neuro/Psych Negative Neurological ROS  Negative Psych ROS   GI/Hepatic Neg liver ROS, hiatal hernia,   Endo/Other  Negative Endocrine ROS  Renal/GU negative Renal ROS  Genitourinary negative   Musculoskeletal negative musculoskeletal ROS (+)   Abdominal   Peds negative pediatric ROS (+)  Hematology Anemia Hgb 11.5   Anesthesia Other Findings   Reproductive/Obstetrics negative OB ROS                          Anesthesia Physical Anesthesia Plan  ASA: III  Anesthesia Plan: General   Post-op Pain Management:    Induction: Intravenous  Airway Management Planned: Oral ETT  Additional Equipment:   Intra-op Plan:   Post-operative Plan: Extubation in OR  Informed Consent: I have reviewed the patients History and Physical, chart, labs and discussed the procedure including the risks, benefits and alternatives for the proposed anesthesia with the patient or authorized representative who has indicated his/her understanding and acceptance.     Plan Discussed with: CRNA and Surgeon  Anesthesia Plan Comments:         Anesthesia Quick Evaluation

## 2011-05-13 NOTE — H&P (Signed)
CC- Jerry [name] is a 78 y.o. male who presents with right knee pain.  HPI- . Knee Pain: Patient presents with knee pain involving the  right knee. Onset of the symptoms was several years ago. Inciting event: none known. Current symptoms include crepitus sensation, giving out, pain located globally throughout knee and stiffness. Pain is aggravated by any weight bearing, going up and down stairs, rising after sitting, squatting, standing and walking.  Patient has had prior knee problems. Evaluation to date: plain films: abnormal end-stage bone on bone OA right knee. Treatment to date: corticosteroid injection which was not very effective, ice, rest and viscosupplements which were ineffective.  Past Medical History  Diagnosis Date  . Dyslipidemia   . Medication intolerance     NIACIN  . CAD (coronary artery disease)     no ischemia nuclear scan 12/2005 EF60% /  nuclear, October, 2012, no ischemia, EF normal  . Atrial flutter     limited post CABG  . Hiatal hernia   . Peptic ulcer disease   . Hypertension   . Hx of CABG     2002  . Ejection fraction     EF. 60%, Nuclear, 2007 /   EF, 60%, nuclear, October, 2012  . Carotid artery disease     Doppler, October, 2011, 40-59% bilateral  . Weight loss     August, 2012  . Myocardial infarction     1996  . Arthritis     knees and shoulders   . Hearing aid worn     bilateral ears     Past Surgical History  Procedure Date  . Coronary artery bypass graft 2002  . Cardiac catheterization   . Joint replacement     left knee replacement 4/11    Prior to Admission medications   Medication Sig Start Date End Date Taking? Authorizing Provider  amLODipine (NORVASC) 10 MG tablet Take 10 mg by mouth daily.     Yes Historical Provider, MD  aspirin 81 MG tablet Take 81 mg by mouth daily.    Yes Historical Provider, MD  gabapentin (NEURONTIN) 600 MG tablet Take 600 mg by mouth 3 (three) times daily.    Yes Historical Provider, MD  metoprolol  (LOPRESSOR) 50 MG tablet Take 25 mg by mouth 3 (three) times daily.    Yes Historical Provider, MD  ranitidine (ZANTAC) 300 MG capsule Take 300 mg by mouth daily.    Yes Historical Provider, MD  simvastatin (ZOCOR) 20 MG tablet TAKE ONE TABLET BY MOUTH AT BEDTIME 02/23/11  Yes Luis Abed, MD  Sulfapyridine POWD Take 250 mg by mouth daily.    Yes Historical Provider, MD  Tamsulosin HCl (FLOMAX) 0.4 MG CAPS Take 0.4 mg by mouth daily.     Yes Historical Provider, MD  fish oil-omega-3 fatty acids 1000 MG capsule Take 3 g by mouth daily.      Historical Provider, MD  Misc Natural Products (OSTEO BI-FLEX ADV JOINT SHIELD PO) Take 1 tablet by mouth 2 (two) times daily.     Historical Provider, MD  Multiple Vitamin (MULITIVITAMIN WITH MINERALS) TABS Take 1 tablet by mouth daily.      Historical Provider, MD   KNEE EXAM antalgic gait, reduced range of motion, negative drawer sign, negative McMurray sign, negative Lachman sign, normal ipsilateral hip exam  Physical Examination: General appearance - alert, well appearing, and in no distress Mental status - alert, oriented to person, place, and time Neck - supple, no significant adenopathy  Lymphatics - no palpable lymphadenopathy, no hepatosplenomegaly Chest - clear to auscultation, no wheezes, rales or rhonchi, symmetric air entry Heart - normal rate, regular rhythm, normal S1, S2, no murmurs, rubs, clicks or gallops Abdomen - soft, nontender, nondistended, no masses or organomegaly Neurological - alert, oriented, normal speech, no focal findings or movement disorder noted   Asessment/Plan--- OA Right knee- - Plan Right Total Knee Arthroplasty. Procedure risks and potential comps discussed with patient who elects to proceed. Goals are decreased pain and increased function with a high likelihood of achieving both

## 2011-05-13 NOTE — Plan of Care (Signed)
Problem: Consults Goal: Diagnosis- Total Joint Replacement Right total knee     

## 2011-05-13 NOTE — Anesthesia Postprocedure Evaluation (Signed)
  Anesthesia Post-op Note  Patient: Jerry [name]  Procedure(s) Performed:  TOTAL KNEE ARTHROPLASTY  Patient Location: PACU  Anesthesia Type: General  Level of Consciousness: oriented and sedated  Airway and Oxygen Therapy: Patient Spontanous Breathing and Patient connected to nasal cannula oxygen  Post-op Pain: mild  Post-op Assessment: Post-op Vital signs reviewed, Patient's Cardiovascular Status Stable, Respiratory Function Stable and Patent Airway  Post-op Vital Signs: stable  Complications: No apparent anesthesia complications

## 2011-05-13 NOTE — Interval H&P Note (Signed)
History and Physical Interval Note:  05/13/2011 8:37 AM  Jerry [name]  has presented today for surgery, with the diagnosis of Osteoarthritis of the Right Knee  The various methods of treatment have been discussed with the patient and family. After consideration of risks, benefits and other options for treatment, the patient has consented to  Procedure(s): TOTAL KNEE ARTHROPLASTY as a surgical intervention .  The patients' history has been reviewed, patient examined, no change in status, stable for surgery.  I have reviewed the patients' chart and labs.  Questions were answered to the patient's satisfaction.     Loanne Drilling

## 2011-05-13 NOTE — Progress Notes (Signed)
Utilization review completed.  

## 2011-05-13 NOTE — Transfer of Care (Signed)
Immediate Anesthesia Transfer of Care Note  Patient: Jerry [name]  Procedure(s) Performed:  TOTAL KNEE ARTHROPLASTY  Patient Location: PACU  Anesthesia Type: General  Level of Consciousness: awake, sedated and responds to stimulation  Airway & Oxygen Therapy: Patient Spontanous Breathing and Patient connected to face mask oxygen  Post-op Assessment: Report given to PACU RN and Post -op Vital signs reviewed and stable  Post vital signs: stable Filed Vitals:   05/13/11 0635  BP: 171/67  Pulse: 71  Temp: 36.6 C  Resp: 18    Complications: No apparent anesthesia complications

## 2011-05-13 NOTE — Op Note (Signed)
Pre-operative diagnosis- Osteoarthritis  Right knee(s)  Post-operative diagnosis- Osteoarthritis Right knee(s)  Procedure-  Right  Total Knee Arthroplasty  Surgeon- Gus Rankin. Dionisia Pacholski, MD  Assistant- Dimitri Ped, PA-C   Anesthesia-  General EBL-* No blood loss amount entered *  Drains Hemovac  Tourniquet time-  Total Tourniquet Time Documented: Thigh (Right) - 44 minutes   Complications- None  Condition-PACU - hemodynamically stable.   Brief Clinical Note  Jerry [name] is a 78 y.o. year old male with end stage OA of his right knee with progressively worsening pain and dysfunction. He has constant pain, with activity and at rest and significant functional deficits with difficulties even with ADLs. He has had extensive non-op management including analgesics, injections of cortisone and viscosupplements, and home exercise program, but remains in significant pain with significant dysfunction. He has bone on bone changes medially and patellofemoral and varus deformity with previous tibial osteotomy. He presents now for right Total Knee Arthroplasty.    Procedure in detail---   The patient is brought into the operating room and positioned supine on the operating table. After successful administration of  General,   a tourniquet is placed high on the  Right thigh(s) and the lower extremity is prepped and draped in the usual sterile fashion. Time out is performed by the operating team and then the  Right lower extremity is wrapped in Esmarch, knee flexed and the tourniquet inflated to 300 mmHg.       A midline incision is made with a ten blade through the subcutaneous tissue to the level of the extensor mechanism. A fresh blade is used to make a medial parapatellar arthrotomy. Soft tissue over the proximal medial tibia is subperiosteally elevated to the joint line with a knife and into the semimembranosus bursa with a Cobb elevator. Soft tissue over the proximal lateral tibia is elevated with  attention being paid to avoiding the patellar tendon on the tibial tubercle. The patella is everted, knee flexed 90 degrees and the ACL and PCL are removed. Findings are bone on bone medial and patellofemoral with large osteophytes.        The drill is used to create a starting hole in the distal femur and the canal is thoroughly irrigated with sterile saline to remove the fatty contents. The 5 degree Right  valgus alignment guide is placed into the femoral canal and the distal femoral cutting block is pinned to remove 11 mm off the distal femur. Resection is made with an oscillating saw.      The tibia is subluxed forward and the menisci are removed. The extramedullary alignment guide is placed referencing proximally at the medial aspect of the tibial tubercle and distally along the second metatarsal axis and tibial crest. The block is pinned to remove 2mm off the more deficient medial side. Resection is made with an oscillating saw.  Size 4 is the most appropriate size for the tibia and the proximal tibia is prepared with the modular drill and keel punch for that size. I did not encounter the tibial staples and thus did not have to remove them      The femoral sizing guide is placed and size 4 is most appropriate. Rotation is marked off the epicondylar axis and confirmed by creating a rectangular flexion gap at 90 degrees. The size 4 cutting block is pinned in this rotation and the anterior, posterior and chamfer cuts are made with the oscillating saw. The intercondylar block is then placed and that cut  is made.      Trial size 4 tibial component, trial size 4 posterior stabilized femur and a 10  mm posterior stabilized rotating platform insert trial is placed. Full extension is achieved with excellent varus/valgus and anterior/posterior balance throughout full range of motion. The patella is everted and thickness measured to be 25  mm. Free hand resection is taken to 15 mm, a 38 template is placed, lug holes  are drilled, trial patella is placed, and it tracks normally. Osteophytes are removed off the posterior femur with the trial in place. All trials are removed and the cut bone surfaces prepared with pulsatile lavage. Cement is mixed and once ready for implantation, the size 4 tibial implant, size  4 posterior stabilized femoral component, and the size 38 patella are cemented in place and the patella is held with the clamp. The trial insert is placed and the knee held in full extension. All extruded cement is removed and once the cement is hard the permanent 10 mm posterior stabilized rotating platform insert is placed into the tibial tray.      The wound is copiously irrigated with saline solution and the extensor mechanism closed over a hemovac drain with #1 PDS suture. The tourniquet is released for a total tourniquet time of 44  minutes. Flexion against gravity is 135 degrees and the patella tracks normally. Subcutaneous tissue is closed with 2.0 vicryl and subcuticular with running 4.0 Monocryl. The catheter for the Marcaine pain pump is placed and the pump is initiated. The incision is cleaned and dried and steri-strips and a bulky sterile dressing are applied. The limb is placed into a knee immobilizer and the patient is awakened and transported to recovery in stable condition.      Please note that a surgical assistant was a medical necessity for this procedure in order to perform it in a safe and expeditious manner. Surgical assistant was necessary to retract the ligaments and vital neurovascular structures to prevent injury to them and also necessary for proper positioning of the limb to allow for anatomic placement of the prosthesis.   Gus Rankin Hussien Greenblatt, MD    05/13/2011, 10:04 AM

## 2011-05-13 NOTE — Transfer of Care (Signed)
Immediate Anesthesia Transfer of Care Note  Patient: Jerry [name]  Procedure(s) Performed:  TOTAL KNEE ARTHROPLASTY  Patient Location: PACU  Anesthesia Type: General  Level of Consciousness: awake, sedated and responds to stimulation  Airway & Oxygen Therapy: Patient Spontanous Breathing and Patient connected to face mask oxygen  Post-op Assessment: Report given to PACU RN and Post -op Vital signs reviewed and stable  Post vital signs: stable Filed Vitals:   05/13/11 1230  BP: 148/61  Pulse: 55  Temp: 36.7 C  Resp: 14    Complications: No apparent anesthesia complications

## 2011-05-14 DIAGNOSIS — D62 Acute posthemorrhagic anemia: Secondary | ICD-10-CM | POA: Diagnosis not present

## 2011-05-14 LAB — BASIC METABOLIC PANEL
CO2: 29 mEq/L (ref 19–32)
Chloride: 98 mEq/L (ref 96–112)
Creatinine, Ser: 0.89 mg/dL (ref 0.50–1.35)
GFR calc Af Amer: >90 mL/min (ref >90)
Potassium: 3.5 mEq/L (ref 3.5–5.1)
Sodium: 135 mEq/L (ref 135–145)

## 2011-05-14 LAB — CBC
Platelets: 180 10*3/uL (ref 150–400)
RBC: 3.12 MIL/uL — ABNORMAL LOW (ref 4.22–5.81)
RDW: 15.1 % (ref 11.5–15.5)
WBC: 7.8 10*3/uL (ref 4.0–10.5)

## 2011-05-14 MED ORDER — MORPHINE SULFATE 2 MG/ML IJ SOLN: 0.5000 mg | INTRAMUSCULAR | Status: DC | PRN

## 2011-05-14 MED ORDER — HYDROCODONE-ACETAMINOPHEN 5-325 MG PO TABS
1.0000 | ORAL_TABLET | ORAL | Status: DC | PRN
  Administered 2011-05-14 – 2011-05-15 (×4): 1 via ORAL
  Administered 2011-05-15: 2 via ORAL
  Administered 2011-05-16: 1 via ORAL
  Filled 2011-05-14 (×2): qty 1
  Filled 2011-05-14: qty 2
  Filled 2011-05-14 (×3): qty 1

## 2011-05-14 MED ORDER — POLYSACCHARIDE IRON 150 MG PO CAPS
150.0000 mg | ORAL_CAPSULE | Freq: Every day | ORAL | Status: DC
  Administered 2011-05-14 – 2011-05-16 (×3): 150 mg via ORAL
  Filled 2011-05-14 (×3): qty 1

## 2011-05-14 MED ORDER — LIP MEDEX EX OINT
TOPICAL_OINTMENT | CUTANEOUS | Status: AC
  Administered 2011-05-14: 08:00:00
  Filled 2011-05-14: qty 7

## 2011-05-14 MED ORDER — FUROSEMIDE 10 MG/ML IJ SOLN
20.0000 mg | Freq: Once | INTRAMUSCULAR | Status: AC
  Administered 2011-05-14: 20 mg via INTRAVENOUS
  Filled 2011-05-14: qty 2

## 2011-05-14 NOTE — Progress Notes (Signed)
Subjective: 1 Day Post-Op Procedure(s) (LRB): TOTAL KNEE ARTHROPLASTY (Right) Patient reports pain as mild and moderate.   Patient seen in rounds by Dr. Lequita Halt. Patient has complaints of some pain. We will start therapy today. Plan is to go [facility] after hospital stay.  Objective: Vital signs in last 24 hours: Temp:  [97.4 F (36.3 C)-100.9 F (38.3 C)] 100.8 F (38.2 C) (01/15 0534) Pulse Rate:  [51-102] 86  (01/15 0534) Resp:  [8-19] 16  (01/15 0534) BP: (128-192)/(49-75) 159/70 mmHg (01/15 0534) SpO2:  [92 %-100 %] 96 % (01/15 0534) Weight:  [83.915 kg (185 lb)] 83.915 kg (185 lb) (01/14 1255)  Intake/Output from previous day:  Intake/Output Summary (Last 24 hours) at 05/14/11 0931 Last data filed at 05/14/11 0745  Gross per 24 hour  Intake   3805 ml  Output   1800 ml  Net   2005 ml    Intake/Output this shift: Total I/O In: 240 [P.O.:240] Out: -  500  Labs:  [facility] 05/14/11 0433  HGB 8.6*    Basename 05/14/11 0433  WBC 7.8  RBC 3.12*  HCT 26.0*  PLT 180    Basename 05/14/11 0433  NA 135  K 3.5  CL 98  CO2 29  BUN 9  CREATININE 0.89  GLUCOSE 149*  CALCIUM 8.3*   No results found for this basename: LABPT:2,INR:2 in the last 72 hours  Exam - Neurovascular intact Sensation intact distally Dressing - clean, dry Motor function intact - moving foot and toes well on exam.  Hemovac pulled without difficulty.  Past Medical History  Diagnosis Date  . Dyslipidemia   . Medication intolerance     NIACIN  . CAD (coronary artery disease)     no ischemia nuclear scan 12/2005 EF60% /  nuclear, October, 2012, no ischemia, EF normal  . Atrial flutter     limited post CABG  . Hiatal hernia   . Peptic ulcer disease   . Hypertension   . Hx of CABG     2002  . Ejection fraction     EF. 60%, Nuclear, 2007 /   EF, 60%, nuclear, October, 2012  . Carotid artery disease     Doppler, October, 2011, 40-59% bilateral  . Weight loss     August, 2012  .  Myocardial infarction     1996  . Arthritis     knees and shoulders   . Hearing aid worn     bilateral ears     Assessment/Plan: 1 Day Post-Op Procedure(s) (LRB): TOTAL KNEE ARTHROPLASTY (Right) Postop Anemia - HGB 8.6 - Iron  Advance diet Up with therapy Discharge to SNF when bed available - [facility]  DVT Prophylaxis - Xarelto  Protocol Weight-Bearing as tolerated to right leg Keep foley until tomorrow. No vaccines. IV push Morphine  After reviewing his previous hospitalization, it is of note that the patient developed some altered mental status postop along with difficulty with his oxygen saturations following his previous left total knee done in May 2011.  Patrica Duel 05/14/2011, 9:31 AM

## 2011-05-14 NOTE — Progress Notes (Signed)
Physical Therapy Evaluation Patient Details Name: Jerry [name] MRN: [id number] DOB: Jan 01, 1930 Today's Date: 05/14/2011 0820 - 6213 Problem List:  Patient Active Problem List  Diagnoses  . Dyslipidemia  . Medication intolerance  . Hx of CABG  . Atrial flutter  . Peptic ulcer disease  . Hypertension  . Carotid artery disease  . Weight loss  . CAD (coronary artery disease)  . Ejection fraction  . OA (osteoarthritis) of knee  . Postop Acute blood loss anemia    Past Medical History:  Past Medical History  Diagnosis Date  . Dyslipidemia   . Medication intolerance     NIACIN  . CAD (coronary artery disease)     no ischemia nuclear scan 12/2005 EF60% /  nuclear, October, 2012, no ischemia, EF normal  . Atrial flutter     limited post CABG  . Hiatal hernia   . Peptic ulcer disease   . Hypertension   . Hx of CABG     2002  . Ejection fraction     EF. 60%, Nuclear, 2007 /   EF, 60%, nuclear, October, 2012  . Carotid artery disease     Doppler, October, 2011, 40-59% bilateral  . Weight loss     August, 2012  . Myocardial infarction     1996  . Arthritis     knees and shoulders   . Hearing aid worn     bilateral ears    Past Surgical History:  Past Surgical History  Procedure Date  . Coronary artery bypass graft 2002  . Cardiac catheterization   . Joint replacement     left knee replacement 4/11    PT Assessment/Plan/Recommendation PT Assessment Clinical Impression Statement: Pt with R TKR presents with decreased R LE strength/ROM and decreased functional mobility.  Pt will benefit from skilled PT intervention to maximize IND for d/c to ST-SNF level rehab and eventual return home. PT Recommendation/Assessment: Patient will need skilled PT in the acute care venue PT Problem List: Decreased strength;Decreased range of motion;Decreased activity tolerance;Decreased mobility;Decreased balance;Decreased knowledge of use of DME;Pain PT Plan PT Frequency: 7X/week PT  Treatment/Interventions: DME instruction;Gait training;Stair training;Functional mobility training;Therapeutic activities;Therapeutic exercise;Patient/family education PT Recommendation Recommendations for Other Services: OT consult Follow Up Recommendations: Skilled nursing facility Equipment Recommended: Defer to next venue PT Goals  Acute Rehab PT Goals PT Goal Formulation: With patient Time For Goal Achievement: 7 days Pt will go Supine/Side to Sit: with supervision PT Goal: Supine/Side to Sit - Progress: Progressing toward goal Pt will go Sit to Supine/Side: with supervision PT Goal: Sit to Supine/Side - Progress: Not met Pt will go Sit to Stand: with supervision PT Goal: Sit to Stand - Progress: Progressing toward goal Pt will go Stand to Sit: with supervision PT Goal: Stand to Sit - Progress: Progressing toward goal Pt will Ambulate: 51 - 150 feet;with supervision;with rolling walker PT Goal: Ambulate - Progress: Progressing toward goal  PT Evaluation Precautions/Restrictions  Precautions Precautions: Knee Required Braces or Orthoses: Yes Knee Immobilizer: Discontinue once straight leg raise with < 10 degree lag Restrictions Weight Bearing Restrictions: No Other Position/Activity Restrictions: WBAT Prior Functioning  Home Living Lives With: Alone Prior Function Level of Independence: Independent with basic ADLs;Independent with transfers;Requires assistive device for independence;Independent with gait Able to Take Stairs?: Yes Driving: Yes Cognition Cognition Arousal/Alertness: Awake/alert Overall Cognitive Status: Appears within functional limits for tasks assessed Orientation Level: Oriented X4 Sensation/Coordination Coordination Gross Motor Movements are Fluid and Coordinated: Yes Extremity Assessment RUE Assessment RUE  Assessment: Within Functional Limits LUE Assessment LUE Assessment: Within Functional Limits RLE Assessment RLE Assessment: Exceptions to  [facility] (-10 - 40 degrees AAROM; quads 2/5) LLE Assessment LLE Assessment: Within Functional Limits Mobility (including Balance) Bed Mobility Bed Mobility: Yes Supine to Sit: 3: Mod assist;HOB flat Supine to Sit Details (indicate cue type and reason): cues for sequence/technique Transfers Transfers: Yes Sit to Stand: 1: +2 Total assist;From bed;With upper extremity assist Sit to Stand Details (indicate cue type and reason): cues for use of UEs and for LE position (pt 60%) Stand to Sit: 1: +2 Total assist;With upper extremity assist;With armrests;To chair/3-in-1 Stand to Sit Details: cues for use of UEs and for LE position (pt 60%) Ambulation/Gait Ambulation/Gait: Yes Ambulation/Gait Assistance: 1: +2 Total assist Ambulation/Gait Assistance Details (indicate cue type and reason): cues for posture, sequence, and position from RW Ambulation Distance (Feet): 8 Feet (ltd by c/o dizziness - BP 132/62; O2 on RA - 87%) Assistive device: Rolling walker Gait Pattern: Step-to pattern    Exercise  Total Joint Exercises Ankle Circles/Pumps: AROM;Both;10 reps;Supine Quad Sets: AROM;10 reps;Supine;Both Heel Slides: AAROM;10 reps;Supine;Right Straight Leg Raises: AAROM;Right;10 reps;Supine End of Session PT - End of Session Equipment Utilized During Treatment: Gait belt;Right knee immobilizer Activity Tolerance: Other (comment) (ltd by c/o dizziness) Patient left: in chair;with call bell in reach Nurse Communication: Mobility status for transfers;Mobility status for ambulation General Behavior During Session: [facility] for tasks performed Cognition: [facility] for tasks performed  Jeana Kersting 05/14/2011, 12:01 PM

## 2011-05-14 NOTE — Progress Notes (Signed)
Physical Therapy Treatment Patient Details Name: Jerry [name] MRN: [id number] DOB: [birth year] Today's Date: 05/14/2011 1218 - 1241; 2GT PT Assessment/Plan  PT - Assessment/Plan PT Plan: Discharge plan remains appropriate PT Frequency: 7X/week Recommendations for Other Services: OT consult Follow Up Recommendations: Skilled nursing facility Equipment Recommended: Defer to next venue PT Goals  Acute Rehab PT Goals PT Goal Formulation: With patient Time For Goal Achievement: 7 days Pt will go Supine/Side to Sit: with supervision Pt will go Sit to Supine/Side: with supervision PT Goal: Sit to Supine/Side - Progress: Progressing toward goal Pt will go Sit to Stand: with supervision PT Goal: Sit to Stand - Progress: Progressing toward goal Pt will go Stand to Sit: with supervision PT Goal: Stand to Sit - Progress: Progressing toward goal Pt will Ambulate: 51 - 150 feet;with supervision;with rolling walker PT Goal: Ambulate - Progress: Progressing toward goal  PT Treatment Precautions/Restrictions  Precautions Precautions: Knee Required Braces or Orthoses: Yes Knee Immobilizer: Discontinue once straight leg raise with < 10 degree lag Restrictions Weight Bearing Restrictions: No Other Position/Activity Restrictions: WBAT Mobility (including Balance) Bed Mobility Sit to Supine: 3: Mod assist Transfers Sit to Stand: 1: +2 Total assist;With upper extremity assist;With armrests;From chair/3-in-1 Sit to Stand Details (indicate cue type and reason): cues for use of UEs and LE position Stand to Sit: 1: +2 Total assist;With upper extremity assist;With armrests;To chair/3-in-1 Stand to Sit Details: cues for use of UEs and LE position Ambulation/Gait Ambulation/Gait Assistance: 1: +2 Total assist Ambulation/Gait Assistance Details (indicate cue type and reason): cues for sequence, posture, and position from RW Ambulation Distance (Feet): 40 Feet Assistive device: Rolling walker Gait  Pattern: Step-to pattern    Exercise    End of Session PT - End of Session Activity Tolerance: Patient tolerated treatment well Patient left: in bed;with call bell in reach Nurse Communication: Mobility status for transfers;Mobility status for ambulation General Behavior During Session: [facility] for tasks performed Cognition: [facility] for tasks performed  Jerry [name] 05/14/2011, 1:51 PM

## 2011-05-15 ENCOUNTER — Encounter (HOSPITAL_COMMUNITY): Payer: Self-pay | Admitting: Orthopedic Surgery

## 2011-05-15 LAB — CBC
HCT: 22.4 % — ABNORMAL LOW (ref 39.0–52.0)
Hemoglobin: 7.5 g/dL — ABNORMAL LOW (ref 13.0–17.0)
MCV: 83.3 fL (ref 78.0–100.0)
RBC: 2.69 MIL/uL — ABNORMAL LOW (ref 4.22–5.81)
RDW: 15.1 % (ref 11.5–15.5)
WBC: 8.1 10*3/uL (ref 4.0–10.5)

## 2011-05-15 LAB — BASIC METABOLIC PANEL
BUN: 9 mg/dL (ref 6–23)
CO2: 30 mEq/L (ref 19–32)
Chloride: 96 mEq/L (ref 96–112)
GFR calc Af Amer: 79 mL/min — ABNORMAL LOW (ref >90)
Potassium: 4 mEq/L (ref 3.5–5.1)

## 2011-05-15 LAB — PREPARE RBC (CROSSMATCH)

## 2011-05-15 MED ORDER — ACETAMINOPHEN 325 MG PO TABS
650.0000 mg | ORAL_TABLET | Freq: Once | ORAL | Status: AC
  Administered 2011-05-15: 650 mg via ORAL
  Filled 2011-05-15: qty 2

## 2011-05-15 NOTE — Progress Notes (Signed)
Subjective: 2 Days Post-Op Procedure(s) (LRB): TOTAL KNEE ARTHROPLASTY (Right) Patient reports pain as mild.   Patient seen in rounds with Dr. Lequita Halt. Patient doing fairly well.  Plan to go to [facility].  HGB is down.  Will give blood.  Objective: Vital signs in last 24 hours: Temp:  [97.4 F (36.3 C)-100.3 F (37.9 C)] 100.3 F (37.9 C) (01/16 0526) Pulse Rate:  [75-92] 92  (01/16 0526) Resp:  [16] 16  (01/16 0526) BP: (104-156)/(63-70) 138/68 mmHg (01/16 0526) SpO2:  [92 %-98 %] 92 % (01/16 0526)  Intake/Output from previous day:  Intake/Output Summary (Last 24 hours) at 05/15/11 1127 Last data filed at 05/15/11 0900  Gross per 24 hour  Intake 2234.99 ml  Output   3350 ml  Net -1115.01 ml    Intake/Output this shift: Total I/O In: 240 [P.O.:240] Out: -   Labs:  Basename 05/15/11 0456 05/14/11 0433  HGB 7.5* 8.6*    Basename 05/15/11 0456 05/14/11 0433  WBC 8.1 7.8  RBC 2.69* 3.12*  HCT 22.4* 26.0*  PLT 152 180    Basename 05/15/11 0456 05/14/11 0433  NA 133* 135  K 4.0 3.5  CL 96 98  CO2 30 29  BUN 9 9  CREATININE 1.01 0.89  GLUCOSE 135* 149*  CALCIUM 8.5 8.3*   No results found for this basename: LABPT:2,INR:2 in the last 72 hours  Exam - Neurovascular intact Sensation intact distally Dressing/Incision - clean, dry, no drainage Motor function intact - moving foot and toes well on exam.   Past Medical History  Diagnosis Date  . Dyslipidemia   . Medication intolerance     NIACIN  . CAD (coronary artery disease)     no ischemia nuclear scan 12/2005 EF60% /  nuclear, October, 2012, no ischemia, EF normal  . Atrial flutter     limited post CABG  . Hiatal hernia   . Peptic ulcer disease   . Hypertension   . Hx of CABG     2002  . Ejection fraction     EF. 60%, Nuclear, 2007 /   EF, 60%, nuclear, October, 2012  . Carotid artery disease     Doppler, October, 2011, 40-59% bilateral  . Weight loss     August, 2012  . Myocardial infarction      1996  . Arthritis     knees and shoulders   . Hearing aid worn     bilateral ears     Assessment/Plan: 2 Days Post-Op Procedure(s) (LRB): TOTAL KNEE ARTHROPLASTY (Right) HGB 7.5 - Blood Today  Up with therapy Discharge to SNF - [facility]  DVT Prophylaxis - Xarelto  Protocol Weight-Bearing as tolerated to right leg  PERKINS, ALEXZANDREW 05/15/2011, 11:27 AM

## 2011-05-15 NOTE — Progress Notes (Signed)
Physical Therapy Treatment Patient Details Name: Jerry [name] MRN: [id number] DOB: June 03, 1929 Today's Date: 05/15/2011  R TKR POD# 2 am session 9:25 - 9:45 1 te  PT Assessment/Plan  PT - Assessment/Plan Comments on Treatment Session: HgB 7.5    TE's only this am then placed pt in CPM PT Plan: Discharge plan remains appropriate Follow Up Recommendations: Skilled nursing facility Equipment Recommended: Defer to next venue PT Goals  Acute Rehab PT Goals PT Goal Formulation: With patient Pt will go Supine/Side to Sit: with supervision PT Goal: Supine/Side to Sit - Progress: Progressing toward goal Pt will go Sit to Supine/Side: with supervision PT Goal: Sit to Supine/Side - Progress: Progressing toward goal Pt will go Sit to Stand: with supervision PT Goal: Sit to Stand - Progress: Progressing toward goal Pt will go Stand to Sit: with supervision PT Goal: Stand to Sit - Progress: Progressing toward goal Pt will Ambulate: 51 - 150 feet;with supervision;with rolling walker PT Goal: Ambulate - Progress: Progressing toward goal  PT Treatment Precautions/Restrictions  Precautions Precautions: Knee Precaution Comments: Pt instructed in KI use and when to D/C Required Braces or Orthoses: Yes Knee Immobilizer: Discontinue once straight leg raise with < 10 degree lag Restrictions Weight Bearing Restrictions: No Other Position/Activity Restrictions: WBAT Mobility (including Balance)   waiting for first unit of blood to finish   Exercise  Total Joint Exercises Ankle Circles/Pumps: AROM;Both;10 reps Quad Sets: AROM;Both;10 reps Gluteal Sets: AROM;Both;10 reps Towel Squeeze: AROM;Both;10 reps Short Arc QuadBarbaraann Boys;Right;10 reps Heel Slides: AAROM;Right;10 reps Hip ABduction/ADduction: AAROM;Right;10 reps Straight Leg Raises: AAROM;10 reps End of Session PT - End of Session Activity Tolerance: Patient tolerated treatment well Patient left: in bed;with call bell in reach Nurse  Communication: Other (comment) (waiting till first unit of blood to complete prior to gettin) General Behavior During Session: [facility] for tasks performed Cognition: [facility] for tasks performed  Felecia Shelling  PTA [facility]  Acute  Rehab Pager     [phone]

## 2011-05-15 NOTE — Progress Notes (Signed)
Physical Therapy Treatment Patient Details Name: Jerry [name] MRN: [id number] DOB: [birth year] Today's Date: 05/15/2011  R TKR POD # 2 pm session 14:30 - 14:55 1gt  1 ta  PT Assessment/Plan  PT - Assessment/Plan Comments on Treatment Session: Nearly completed first unit og blood, feeling better, no c/o dizzyness. PT Plan: Discharge plan remains appropriate Follow Up Recommendations: Skilled nursing facility Equipment Recommended: Defer to next venue Vitals Supine: BP 139/63, HR 77, 2lts O2 94% EOB: BP 144/62, HR 75, 2 lts O2 96% No c/o dizzyness PT Goals  Acute Rehab PT Goals PT Goal Formulation: With patient Pt will go Supine/Side to Sit: with supervision PT Goal: Supine/Side to Sit - Progress: Progressing toward goal Pt will go Sit to Supine/Side: with supervision PT Goal: Sit to Supine/Side - Progress: Progressing toward goal Pt will go Sit to Stand: with supervision PT Goal: Sit to Stand - Progress: Progressing toward goal Pt will go Stand to Sit: with supervision PT Goal: Stand to Sit - Progress: Progressing toward goal Pt will Ambulate: 51 - 150 feet;with supervision;with rolling walker PT Goal: Ambulate - Progress: Progressing toward goal  PT Treatment Precautions/Restrictions  Precautions Precautions: Knee Precaution Comments: Pt instructed in KI use and when to D/C Required Braces or Orthoses: Yes Knee Immobilizer: Discontinue once straight leg raise with < 10 degree lag Restrictions Weight Bearing Restrictions: No Other Position/Activity Restrictions: WBAT Mobility (including Balance) Bed Mobility Bed Mobility: Yes Supine to Sit: 3: Mod assist Supine to Sit Details (indicate cue type and reason): HOB increased 45' and increased time Transfers Transfers: Yes Sit to Stand: 1: +2 Total assist Sit to Stand Details (indicate cue type and reason): Total assist + 2 pt 75% with 25% VC's on hand placement and increased time.  No c/o dizzyness Stand to Sit: 1: +2  Total assist Stand to Sit Details: total assist + 2 pt 75% with 25% VC's on hand placement prior to sit and increased time Ambulation/Gait Ambulation/Gait: Yes Ambulation/Gait Assistance: 1: +2 Total assist Ambulation/Gait Assistance Details (indicate cue type and reason): total assist + 2 (safety/equipment) pt 75% with 25% VC's on sequencing, proper walker to self distance and increased posture on 2 lts O2 at 96% Ambulation Distance (Feet): 80 Feet Assistive device: Rolling walker Gait Pattern: Step-to pattern;Trunk flexed Gait velocity: Chair following behind for safety Stairs: No Wheelchair Mobility Wheelchair Mobility: No    Exercise   End of Session PT - End of Session Equipment Utilized During Treatment: Gait belt Activity Tolerance: Patient tolerated treatment well Patient left: in chair;with call bell in reach Nurse Communication: Mobility status for ambulation General Behavior During Session: [facility] for tasks performed Cognition: [facility] for tasks performed (very knowledgable as he had L TKR 4/11)  Felecia Shelling  PTA [facility]  Acute  Rehab Pager     [phone]

## 2011-05-15 NOTE — Progress Notes (Signed)
  CARE MANAGEMENT NOTE 05/15/2011  Patient:  Jerry [name], Jerry [name]   Account Number:  [id number]  Date Initiated:  05/15/2011  Documentation initiated by:  Colleen Can  Subjective/Objective Assessment:   dx Osteorthritis of rt knee; Total knee replacemnt     Action/Plan:   Plans are for ST SNF   Anticipated DC Date:  05/16/2011   Anticipated DC Plan:  SKILLED NURSING FACILITY  In-house referral  Clinical Social Worker      DC Planning Services  NA      [facility] Choice  NA   Choice offered to / List presented to:  NA   DME arranged  NA      DME agency  NA     HH arranged  NA      HH agency  NA   Status of service:  Completed, signed off Medicare Important Message given?  NA - LOS <3 / Initial given by admissions

## 2011-05-15 NOTE — Progress Notes (Signed)
FL2 in shadow chart for MD signature. Pt plans to have ST rehab at [facility]. SNF has confirmed plan. CSW will assit with d/c planning to SNF. Brief psychosocial assessment in shadow chart.

## 2011-05-15 NOTE — Progress Notes (Signed)
Occupational Therapy Note Note pt's Hgb is 7.5 and pt to receive 2 units of blood today. Will hold on OT eval for now and check back at a later time. Judithann Sauger OTR/L [phone] 05/15/2011

## 2011-05-16 DIAGNOSIS — E871 Hypo-osmolality and hyponatremia: Secondary | ICD-10-CM | POA: Diagnosis not present

## 2011-05-16 DIAGNOSIS — Z9289 Personal history of other medical treatment: Secondary | ICD-10-CM

## 2011-05-16 LAB — BASIC METABOLIC PANEL
BUN: 13 mg/dL (ref 6–23)
CO2: 29 mEq/L (ref 19–32)
Glucose, Bld: 121 mg/dL — ABNORMAL HIGH (ref 70–99)
Potassium: 3.6 mEq/L (ref 3.5–5.1)
Sodium: 136 mEq/L (ref 135–145)

## 2011-05-16 LAB — CBC
HCT: 25.4 % — ABNORMAL LOW (ref 39.0–52.0)
Hemoglobin: 8.3 g/dL — ABNORMAL LOW (ref 13.0–17.0)
Hemoglobin: 8.5 g/dL — ABNORMAL LOW (ref 13.0–17.0)
MCH: 27.7 pg (ref 26.0–34.0)
MCH: 27.9 pg (ref 26.0–34.0)
RBC: 3 MIL/uL — ABNORMAL LOW (ref 4.22–5.81)
RBC: 3.05 MIL/uL — ABNORMAL LOW (ref 4.22–5.81)

## 2011-05-16 MED ORDER — RIVAROXABAN 10 MG PO TABS: 10.0000 mg | ORAL_TABLET | Freq: Every day | ORAL | Status: DC

## 2011-05-16 MED ORDER — HYDROCODONE-ACETAMINOPHEN 5-325 MG PO TABS: 1.0000 | ORAL_TABLET | ORAL | Status: AC | PRN

## 2011-05-16 MED ORDER — METHOCARBAMOL 500 MG PO TABS: 500.0000 mg | ORAL_TABLET | Freq: Four times a day (QID) | ORAL | Status: AC | PRN

## 2011-05-16 MED ORDER — POLYSACCHARIDE IRON 150 MG PO CAPS: 150.0000 mg | ORAL_CAPSULE | Freq: Every day | ORAL | Status: DC

## 2011-05-16 MED ORDER — ACETAMINOPHEN 325 MG PO TABS: 650.0000 mg | ORAL_TABLET | Freq: Four times a day (QID) | ORAL | Status: AC | PRN

## 2011-05-16 MED ORDER — ONDANSETRON HCL 4 MG PO TABS: 4.0000 mg | ORAL_TABLET | Freq: Four times a day (QID) | ORAL | Status: AC | PRN

## 2011-05-16 MED ORDER — POLYETHYLENE GLYCOL 3350 17 G PO PACK: 17.0000 g | PACK | Freq: Every day | ORAL | Status: AC | PRN

## 2011-05-16 MED ORDER — BISACODYL 10 MG RE SUPP: 10.0000 mg | Freq: Every day | RECTAL | Status: AC | PRN

## 2011-05-16 MED ORDER — DSS 100 MG PO CAPS: 100.0000 mg | ORAL_CAPSULE | Freq: Two times a day (BID) | ORAL | Status: AC

## 2011-05-16 NOTE — Discharge Summary (Signed)
Physician Discharge Summary   Patient ID: Jerry [name] MRN: [id number] DOB/AGE: 08-01-1929 78 y.o.  Admit date: 05/13/2011 Discharge date: 05/16/2011  Primary Diagnosis: OA Right knee  Admission Diagnoses: Past Medical History  Diagnosis Date  . Dyslipidemia   . Medication intolerance     NIACIN  . CAD (coronary artery disease)     no ischemia nuclear scan 12/2005 EF60% /  nuclear, October, 2012, no ischemia, EF normal  . Atrial flutter     limited post CABG  . Hiatal hernia   . Peptic ulcer disease   . Hypertension   . Hx of CABG     2002  . Ejection fraction     EF. 60%, Nuclear, 2007 /   EF, 60%, nuclear, October, 2012  . Carotid artery disease     Doppler, October, 2011, 40-59% bilateral  . Weight loss     August, 2012  . Myocardial infarction     1996  . Arthritis     knees and shoulders   . Hearing aid worn     bilateral ears    Discharge Diagnoses:  Principal Problem:  *OA (osteoarthritis) of knee Active Problems:  Postop Acute blood loss anemia  Postop Transfusion history  Postop Hyponatremia  Procedure: Procedure(s) (LRB): TOTAL KNEE ARTHROPLASTY (Right)   Consults: none  HPI: MUZAMMIL BRUINS is a 78 y.o. year old male with end stage OA of his right knee with progressively worsening pain and dysfunction. He has constant pain, with activity and at rest and significant functional deficits with difficulties even with ADLs. He has had extensive non-op management including analgesics, injections of cortisone and viscosupplements, and home exercise program, but remains in significant pain with significant dysfunction. He has bone on bone changes medially and patellofemoral and varus deformity with previous tibial osteotomy. He presents now for right Total Knee Arthroplasty.   Laboratory Data: Hospital Outpatient Visit on 05/03/2011  Component Date Value Range Status  . aPTT (seconds) 05/03/2011 37  24-37 Final   Comment:                                 IF  BASELINE aPTT IS ELEVATED,                          SUGGEST PATIENT RISK ASSESSMENT                          BE USED TO DETERMINE APPROPRIATE                          ANTICOAGULANT THERAPY.  . WBC (K/uL) 05/03/2011 6.7  4.0-10.5 Final  . RBC (MIL/uL) 05/03/2011 4.28  4.22-5.81 Final  . Hemoglobin (g/dL) 95/62/1308 65.7* 84.6-96.2 Final  . HCT (%) 05/03/2011 35.8* 39.0-52.0 Final  . MCV (fL) 05/03/2011 83.6  78.0-100.0 Final  . MCH (pg) 05/03/2011 26.9  26.0-34.0 Final  . MCHC (g/dL) 95/28/4132 44.0  10.2-72.5 Final  . RDW (%) 05/03/2011 15.0  11.5-15.5 Final  . Platelets (K/uL) 05/03/2011 258  150-400 Final  . Sodium (mEq/L) 05/03/2011 139  135-145 Final  . Potassium (mEq/L) 05/03/2011 3.6  3.5-5.1 Final  . Chloride (mEq/L) 05/03/2011 102  96-112 Final  . CO2 (mEq/L) 05/03/2011 29  19-32 Final  . Glucose, Bld (mg/dL) 36/64/4034 98  74-25 Final  . BUN (mg/dL)  05/03/2011 11  6-23 Final  . Creatinine, Ser (mg/dL) 16/01/9603 5.40  9.81-1.91 Final  . Calcium (mg/dL) 47/82/9562 9.4  1.3-08.6 Final  . Total Protein (g/dL) 57/84/6962 7.8  9.5-2.8 Final  . Albumin (g/dL) 41/32/4401 3.5  0.2-7.2 Final  . AST (U/L) 05/03/2011 17  0-37 Final  . ALT (U/L) 05/03/2011 11  0-53 Final  . Alkaline Phosphatase (U/L) 05/03/2011 86  39-117 Final  . Total Bilirubin (mg/dL) 53/66/4403 0.3  4.7-4.2 Final  . GFR calc non Af Amer (mL/min) 05/03/2011 82* >90 Final  . GFR calc Af Amer (mL/min) 05/03/2011 >90  >90 Final   Comment:                                 The eGFR has been calculated                          using the CKD EPI equation.                          This calculation has not been                          validated in all clinical                          situations.                          eGFR's persistently                          <90 mL/min signify                          possible Chronic Kidney Disease.  Marland Kitchen Prothrombin Time (seconds) 05/03/2011 13.9  11.6-15.2 Final  . INR   05/03/2011 1.05  0.00-1.49 Final  . Color, Urine  05/03/2011 YELLOW  YELLOW Final  . APPearance  05/03/2011 CLEAR  CLEAR Final  . Specific Gravity, Urine  05/03/2011 1.015  1.005-1.030 Final  . pH  05/03/2011 7.5  5.0-8.0 Final  . Glucose, UA (mg/dL) 59/56/3875 NEGATIVE  NEGATIVE Final  . Hgb urine dipstick  05/03/2011 NEGATIVE  NEGATIVE Final  . Bilirubin Urine  05/03/2011 NEGATIVE  NEGATIVE Final  . Ketones, ur (mg/dL) 64/33/2951 NEGATIVE  NEGATIVE Final  . Protein, ur (mg/dL) 88/41/6606 NEGATIVE  NEGATIVE Final  . Urobilinogen, UA (mg/dL) 30/16/0109 1.0  3.2-3.5 Final  . Nitrite  05/03/2011 NEGATIVE  NEGATIVE Final  . Leukocytes, UA  05/03/2011 NEGATIVE  NEGATIVE Final   MICROSCOPIC NOT DONE ON URINES WITH NEGATIVE PROTEIN, BLOOD, LEUKOCYTES, NITRITE, OR GLUCOSE <1000 mg/dL.  Marland Kitchen MRSA, PCR  05/03/2011 NEGATIVE  NEGATIVE Final  . Staphylococcus aureus  05/03/2011 NEGATIVE  NEGATIVE Final   Comment:                                 The Xpert SA Assay (FDA                          approved for NASAL specimens  only), is one component of                          a comprehensive surveillance                          program.  It is not intended                          to diagnose infection nor to                          guide or monitor treatment.    Basename 05/16/11 0440 05/16/11 0059 05/15/11 0456 05/14/11 0433  HGB 8.5* 8.3* 7.5* 8.6*    Basename 05/16/11 0440 05/16/11 0059  WBC 6.6 6.7  RBC 3.05* 3.00*  HCT 25.4* 24.9*  PLT 152 154    Basename 05/16/11 0440 05/15/11 0456  NA 136 133*  K 3.6 4.0  CL 101 96  CO2 29 30  BUN 13 9  CREATININE 0.91 1.01  GLUCOSE 121* 135*  CALCIUM 8.5 8.5   No results found for this basename: LABPT:2,INR:2 in the last 72 hours  X-Rays:No results found.  EKG: Orders placed in visit on 01/03/11  . EKG 12-LEAD     Hospital Course: Patient was admitted to [facility] and taken to the OR and underwent  the above state procedure without complications.  Patient tolerated the procedure well and was later transferred to the recovery room and then to the orthopaedic floor for postoperative care.  They were given PO and IV analgesics for pain control following their surgery.  They were given 24 hours of postoperative antibiotics and started on DVT prophylaxis.   PT and OT were ordered for total joint protocol.  Discharge planning consulted to help with postop disposition and equipment needs. Patient wanted to go to [facility].  Patient had a decent night except for pain on the evening of surgery and started to get up with therapy on day one.  He was place on IV and PO pain meds. After reviewing his previous hospitalization, it is of note that the patient developed some altered mental status postop along with difficulty with his oxygen saturations following his previous left total knee done in May 2011.  We monitored the patient for any mental Status changes but he did very well following the surgery. Hemovac drain was pulled without difficulty.  Continued to progress with therapy into day two.  Dressing was changed on day two and the incision was healing well.  By day three, the patient had progressed with therapy and meeting goals.  Incision was healing well.  Patient was seen in rounds and was ready to go to [place].  It was decided to transfer the patient once the bed was available.  Discharge Medications: Prior to Admission medications   Medication Sig Start Date End Date Taking? Authorizing Provider  amLODipine (NORVASC) 10 MG tablet Take 10 mg by mouth daily.     Yes Historical Provider, MD  gabapentin (NEURONTIN) 600 MG tablet Take 600 mg by mouth 3 (three) times daily.    Yes Historical Provider, MD  metoprolol (LOPRESSOR) 50 MG tablet Take 25 mg by mouth 3 (three) times daily.    Yes Historical Provider, MD  ranitidine (ZANTAC) 300 MG capsule Take 300 mg by mouth daily.    Yes  Historical Provider, MD    simvastatin (ZOCOR) 20 MG tablet TAKE ONE TABLET BY MOUTH AT BEDTIME 02/23/11  Yes Luis Abed, MD  Sulfapyridine POWD Take 250 mg by mouth daily.    Yes Historical Provider, MD  Tamsulosin HCl (FLOMAX) 0.4 MG CAPS Take 0.4 mg by mouth daily.     Yes Historical Provider, MD  acetaminophen (TYLENOL) 325 MG tablet Take 2 tablets (650 mg total) by mouth every 6 (six) hours as needed (or Fever >/= 101). 05/16/11 05/15/12  Mekhi Lascola, PA  bisacodyl (DULCOLAX) 10 MG suppository Place 1 suppository (10 mg total) rectally daily as needed. 05/16/11 05/26/11  Jayland Null, PA  docusate sodium 100 MG CAPS Take 100 mg by mouth 2 (two) times daily. 05/16/11 05/26/11  Kaaren Nass Julien Girt, PA  HYDROcodone-acetaminophen (NORCO) 5-325 MG per tablet Take 1-2 tablets by mouth every 4 (four) hours as needed (every 4-6 hours prn). 05/16/11 05/26/11  Mehran Guderian, PA  methocarbamol (ROBAXIN) 500 MG tablet Take 1 tablet (500 mg total) by mouth every 6 (six) hours as needed. 05/16/11 05/26/11  Duey Liller, PA  ondansetron (ZOFRAN) 4 MG tablet Take 1 tablet (4 mg total) by mouth every 6 (six) hours as needed for nausea. 05/16/11 05/23/11  Deanda Ruddell, PA  polyethylene glycol (MIRALAX / GLYCOLAX) packet Take 17 g by mouth daily as needed. 05/16/11 05/19/11  Carolanne Mercier, PA  polysaccharide iron (NIFEREX) 150 MG CAPS capsule Take 1 capsule (150 mg total) by mouth daily. Take for three weeks and then discontinue. 05/16/11   Elva Breaker Julien Girt, PA  rivaroxaban (XARELTO) 10 MG TABS tablet Take 1 tablet (10 mg total) by mouth daily with breakfast. Take for three weeks and then discontinue.  May resume Baby Aspirin 81 mg after completion of XARLETO. 05/16/11   Patrica Duel, PA Take for three weeks and then discontinue. He may resume his Baby Aspirin after completion of the XARELTO.    Diet: heart healthy  Activity:WBAT   Follow-up:in 2 weeks  Disposition: [facility]  Discharged  Condition: good   Discharge Orders    Future Orders Please Complete By Expires   Diet - low sodium heart healthy      Call MD / Call 911      Comments:   If you experience chest pain or shortness of breath, CALL 911 and be transported to the hospital emergency room.  If you develope a fever above 101 F, pus (white drainage) or increased drainage or redness at the wound, or calf pain, call your surgeon's office.   Constipation Prevention      Comments:   Drink plenty of fluids.  Prune juice may be helpful.  You may use a stool softener, such as Colace (over the counter) 100 mg twice a day.  Use MiraLax (over the counter) for constipation as needed.   Increase activity slowly as tolerated      Weight Bearing as taught in Physical Therapy      Comments:   Use a walker or crutches as instructed.   Discharge instructions      Comments:   Pick up stool softner and laxative for home. Do not submerge incision under water. May shower. Continue to use ice for pain and swelling from surgery.    Driving restrictions      Comments:   No driving   Lifting restrictions      Comments:   No lifting   TED hose      Comments:   Use stockings (  TED hose) for 3 weeks on both leg(s).  You may remove them at night for sleeping.   Change dressing      Comments:   Change dressing daily with sterile 4 x 4 inch gauze dressing and apply TED hose.   Do not put a pillow under the knee. Place it under the heel.        Current Discharge Medication List    START taking these medications   Details  acetaminophen (TYLENOL) 325 MG tablet Take 2 tablets (650 mg total) by mouth every 6 (six) hours as needed (or Fever >/= 101). Qty: 60 tablet, Refills: 0    bisacodyl (DULCOLAX) 10 MG suppository Place 1 suppository (10 mg total) rectally daily as needed. Qty: 12 suppository, Refills: 0    docusate sodium 100 MG CAPS Take 100 mg by mouth 2 (two) times daily. Qty: 60 capsule, Refills: 0      HYDROcodone-acetaminophen (NORCO) 5-325 MG per tablet Take 1-2 tablets by mouth every 4 (four) hours as needed (every 4-6 hours prn). Qty: 80 tablet, Refills: 0    methocarbamol (ROBAXIN) 500 MG tablet Take 1 tablet (500 mg total) by mouth every 6 (six) hours as needed. Qty: 80 tablet, Refills: 0    ondansetron (ZOFRAN) 4 MG tablet Take 1 tablet (4 mg total) by mouth every 6 (six) hours as needed for nausea. Qty: 30 tablet, Refills: 0    polyethylene glycol (MIRALAX / GLYCOLAX) packet Take 17 g by mouth daily as needed. Qty: 14 each, Refills: 0    polysaccharide iron (NIFEREX) 150 MG CAPS capsule Take 1 capsule (150 mg total) by mouth daily. Take for three weeks and then discontinue. Qty: 21 each, Refills: 0    rivaroxaban (XARELTO) 10 MG TABS tablet Take 1 tablet (10 mg total) by mouth daily with breakfast. Take for three weeks and then discontinue.  May resume Baby Aspirin 81 mg after completion of XARLETO. Qty: 18 tablet, Refills: 0      CONTINUE these medications which have NOT CHANGED   Details  amLODipine (NORVASC) 10 MG tablet Take 10 mg by mouth daily.      gabapentin (NEURONTIN) 600 MG tablet Take 600 mg by mouth 3 (three) times daily.     metoprolol (LOPRESSOR) 50 MG tablet Take 25 mg by mouth 3 (three) times daily.     ranitidine (ZANTAC) 300 MG capsule Take 300 mg by mouth daily.     simvastatin (ZOCOR) 20 MG tablet TAKE ONE TABLET BY MOUTH AT BEDTIME Qty: 90 tablet, Refills: 2    Sulfapyridine POWD Take 250 mg by mouth daily.     Tamsulosin HCl (FLOMAX) 0.4 MG CAPS Take 0.4 mg by mouth daily.        STOP taking these medications     aspirin 81 MG tablet      fish oil-omega-3 fatty acids 1000 MG capsule      Misc Natural Products (OSTEO BI-FLEX ADV JOINT SHIELD PO)      Multiple Vitamin (MULITIVITAMIN WITH MINERALS) TABS        Follow-up Information    Follow up with Loanne Drilling, MD. Schedule an appointment as soon as possible for a visit in 2  weeks. (Please have [facility] arrange follow up and transportation.)    Contact information:   [facility] [address] [place] [address] [phone]          Signed: Patrica Duel 05/16/2011, 9:18 AM

## 2011-05-16 NOTE — Progress Notes (Signed)
Physical Therapy Treatment Patient Details Name: Jerry [name] MRN: [id number] DOB: February 07, 1930 Today's Date: 05/16/2011  R TKR POD #3 am session 10:00 - 10:25 2 te  PT Assessment/Plan  PT - Assessment/Plan Comments on Treatment Session: pt states he feels better and got better sleep PT Plan: Discharge plan remains appropriate Follow Up Recommendations: Skilled nursing facility PT Goals  Acute Rehab PT Goals PT Goal Formulation: With patient Pt will go Supine/Side to Sit: with supervision PT Goal: Supine/Side to Sit - Progress: Progressing toward goal Pt will go Sit to Supine/Side: with supervision PT Goal: Sit to Supine/Side - Progress: Progressing toward goal Pt will go Sit to Stand: with supervision PT Goal: Sit to Stand - Progress: Progressing toward goal Pt will go Stand to Sit: with supervision PT Goal: Stand to Sit - Progress: Progressing toward goal Pt will Ambulate: 51 - 150 feet;with supervision;with rolling walker PT Goal: Ambulate - Progress: Progressing toward goal  PT Treatment Precautions/Restrictions  Precautions Precautions: Knee Precaution Comments: Pt instructed in KI use and when to D/C Required Braces or Orthoses: Yes Knee Immobilizer: Discontinue once straight leg raise with < 10 degree lag Restrictions Weight Bearing Restrictions: Yes Other Position/Activity Restrictions: WBAT Mobility (including Balance)   TE's only this session 2nd increased pain after, applied ICE and allowed a rest break   Exercise  Total Joint Exercises Ankle Circles/Pumps: AROM;Both;10 reps Quad Sets: AROM;Both;10 reps Gluteal Sets: AROM;Both;10 reps Towel Squeeze: AROM;Both;10 reps Short Arc Quad: AAROM;10 reps;Right Heel Slides: AAROM;Right;10 reps Hip ABduction/ADduction: AAROM;Right;10 reps Straight Leg Raises: AAROM;Right;10 reps End of Session PT - End of Session Activity Tolerance: Patient tolerated treatment well Patient left: in bed;with call bell in  reach General Behavior During Session: [facility] for tasks performed Cognition: [facility] for tasks performed  Felecia Shelling  PTA [facility]  Acute  Rehab Pager     [phone]

## 2011-05-16 NOTE — Progress Notes (Signed)
Pt d/c to snf, [facility].  Pt transferred via ambulance at this time.  Pt's son at bedside upon pt's d/c.  Pt stable and ready for d/c.

## 2011-05-16 NOTE — Progress Notes (Signed)
OT Screen Order received, chart reviewed. Pt to d/c to ST-SNF today. Will sign off and defer OT eval to that venue.  Garrel Ridgel, OTR/L  Pager [phone] 05/16/2011

## 2011-07-09 ENCOUNTER — Other Ambulatory Visit: Payer: Self-pay | Admitting: Family Medicine

## 2011-07-09 DIAGNOSIS — R51 Headache: Secondary | ICD-10-CM

## 2011-07-10 ENCOUNTER — Other Ambulatory Visit: Payer: Self-pay | Admitting: Family Medicine

## 2011-07-10 ENCOUNTER — Ambulatory Visit
Admission: RE | Admit: 2011-07-10 | Discharge: 2011-07-10 | Disposition: A | Discharge status: Home / Self Care | Payer: Medicare Other | Source: Ambulatory Visit | Attending: Family Medicine | Admitting: Family Medicine

## 2011-07-10 DIAGNOSIS — Z139 Encounter for screening, unspecified: Secondary | ICD-10-CM

## 2011-07-11 ENCOUNTER — Ambulatory Visit
Admission: RE | Admit: 2011-07-11 | Discharge: 2011-07-11 | Disposition: A | Discharge status: Home / Self Care | Payer: Medicare Other | Source: Ambulatory Visit | Attending: Family Medicine | Admitting: Family Medicine

## 2011-07-11 DIAGNOSIS — R51 Headache: Secondary | ICD-10-CM

## 2011-07-14 IMAGING — CR DG CHEST 1V PORT
1 series · 1 of 1 positions shown · non-contrast
Comparison: Chest x-ray 08/21/2009.

CLINICAL DATA: Postop knee replacement surgery.  Wheezing.

PORTABLE CHEST - 1 VIEW

[view not recorded]
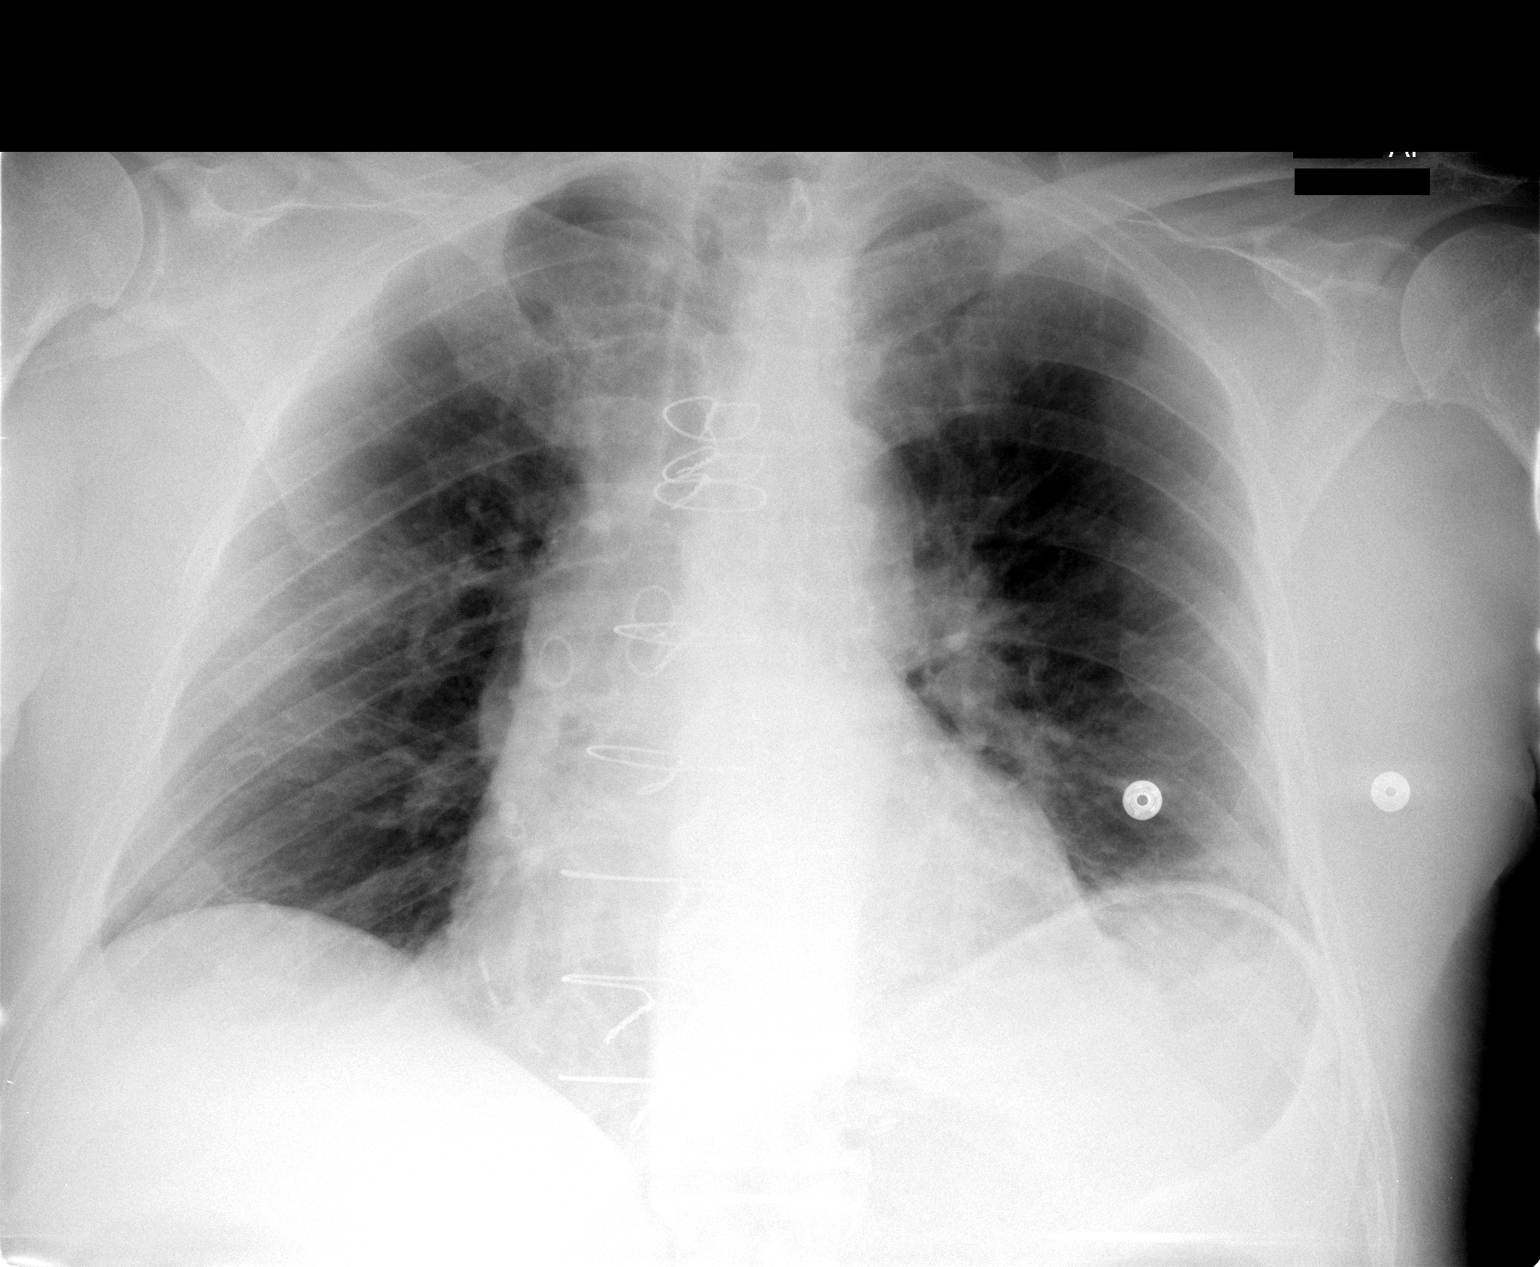

[1 of 1 positions shown; findings below may reference images not displayed]

FINDINGS: The cardiac silhouette, mediastinal and hilar contours
are stable given the eight P E projection.  There are low lung
volumes with streaky areas of atelectasis but no infiltrates, edema
or effusions.  No pneumothorax.
IMPRESSION: Low lung volumes with streaky areas of atelectasis and mild
vascular crowding but no infiltrates, edema or effusions.

## 2011-07-31 ENCOUNTER — Other Ambulatory Visit: Payer: Self-pay | Admitting: *Deleted

## 2011-07-31 DIAGNOSIS — I6529 Occlusion and stenosis of unspecified carotid artery: Secondary | ICD-10-CM

## 2011-08-01 ENCOUNTER — Encounter (INDEPENDENT_AMBULATORY_CARE_PROVIDER_SITE_OTHER): Payer: Medicare Other

## 2011-08-01 DIAGNOSIS — I6529 Occlusion and stenosis of unspecified carotid artery: Secondary | ICD-10-CM

## 2011-08-06 ENCOUNTER — Encounter: Payer: Self-pay | Admitting: Cardiology

## 2011-08-09 ENCOUNTER — Telehealth: Payer: Self-pay | Admitting: Cardiology

## 2011-08-09 NOTE — Telephone Encounter (Signed)
Patient returning nurse call, he can be reached at [phone] today.

## 2011-08-09 NOTE — Telephone Encounter (Signed)
Pt was given the results of his carotid doppler per Dr Myrtis Ser

## 2011-11-04 ENCOUNTER — Other Ambulatory Visit: Payer: Self-pay | Admitting: Cardiology

## 2011-11-04 NOTE — Telephone Encounter (Signed)
Refilled metoprolol 

## 2011-12-09 ENCOUNTER — Other Ambulatory Visit: Payer: Self-pay | Admitting: Cardiology

## 2012-01-06 ENCOUNTER — Other Ambulatory Visit: Payer: Self-pay | Admitting: Cardiology

## 2012-01-07 ENCOUNTER — Ambulatory Visit (INDEPENDENT_AMBULATORY_CARE_PROVIDER_SITE_OTHER): Payer: Medicare Other | Admitting: Cardiology

## 2012-01-07 ENCOUNTER — Encounter: Payer: Self-pay | Admitting: Cardiology

## 2012-01-07 VITALS — BP 146/65 | HR 59 | Resp 17 | Ht 73.0 in | Wt 205.4 lb

## 2012-01-07 DIAGNOSIS — E785 Hyperlipidemia, unspecified: Secondary | ICD-10-CM

## 2012-01-07 DIAGNOSIS — R9430 Abnormal result of cardiovascular function study, unspecified: Secondary | ICD-10-CM

## 2012-01-07 DIAGNOSIS — I10 Essential (primary) hypertension: Secondary | ICD-10-CM

## 2012-01-07 DIAGNOSIS — R0989 Other specified symptoms and signs involving the circulatory and respiratory systems: Secondary | ICD-10-CM

## 2012-01-07 DIAGNOSIS — I779 Disorder of arteries and arterioles, unspecified: Secondary | ICD-10-CM

## 2012-01-07 DIAGNOSIS — I2510 Atherosclerotic heart disease of native coronary artery without angina pectoris: Secondary | ICD-10-CM

## 2012-01-07 MED ORDER — ASPIRIN EC 81 MG PO TBEC: 81.0000 mg | DELAYED_RELEASE_TABLET | Freq: Every day | ORAL | Status: AC

## 2012-01-07 NOTE — Assessment & Plan Note (Signed)
Systolic blood pressure is slightly elevated today. I've chosen not to change his medications.

## 2012-01-07 NOTE — Assessment & Plan Note (Signed)
Lipids are being treated. No change in therapy. 

## 2012-01-07 NOTE — Progress Notes (Signed)
HPI  Patient is seen for cardiology followup at one year. I saw him in September, 2012. After that time he needed to be cleared for his knee surgery. A nuclear scan was done showing no ischemia. I have reviewed the records. He had successful knee surgery and he is doing very well. He does have significant carotid disease. His last Doppler was done in April. There was a mild change. It was felt that 6 month followup was needed. This should occur sometime in October, 2013.  He's not having any chest pain or shortness of breath. He is fully active.  No Known Allergies  Current Outpatient Prescriptions  Medication Sig Dispense Refill  . acetaminophen (TYLENOL) 325 MG tablet Take 2 tablets (650 mg total) by mouth every 6 (six) hours as needed (or Fever >/= 101).  60 tablet  0  . amLODipine (NORVASC) 10 MG tablet Take 10 mg by mouth daily.        Marland Kitchen amLODipine (NORVASC) 10 MG tablet TAKE ONE TABLET BY MOUTH EVERY DAY  30 tablet  1  . gabapentin (NEURONTIN) 600 MG tablet Take 600 mg by mouth 3 (three) times daily.       . metoprolol (LOPRESSOR) 50 MG tablet Take 25 mg by mouth 3 (three) times daily.       . metoprolol (LOPRESSOR) 50 MG tablet TAKE ONE-HALF TABLET BY MOUTH THREE TIMES DAILY  135 tablet  3  . polysaccharide iron (NIFEREX) 150 MG CAPS capsule Take 1 capsule (150 mg total) by mouth daily. Take for three weeks and then discontinue.  21 each  0  . ranitidine (ZANTAC) 300 MG capsule Take 300 mg by mouth daily.       . rivaroxaban (XARELTO) 10 MG TABS tablet Take 1 tablet (10 mg total) by mouth daily with breakfast. Take for three weeks and then discontinue.  May resume Baby Aspirin 81 mg after completion of XARLETO.  18 tablet  0  . simvastatin (ZOCOR) 20 MG tablet TAKE ONE TABLET BY MOUTH AT BEDTIME  90 tablet  0  . Sulfapyridine POWD Take 250 mg by mouth daily.       . Tamsulosin HCl (FLOMAX) 0.4 MG CAPS Take 0.4 mg by mouth daily.          History   Social History  . Marital  Status: Widowed    Spouse Name: N/A    Number of Children: N/A  . Years of Education: N/A   Occupational History  . Not on file.   Social History Main Topics  . Smoking status: Former Games developer  . Smokeless tobacco: Never Used  . Alcohol Use: No  . Drug Use: No  . Sexually Active: Not on file   Other Topics Concern  . Not on file   Social History Narrative  . No narrative on file    No family history on file.  Past Medical History  Diagnosis Date  . Dyslipidemia   . Medication intolerance     NIACIN  . CAD (coronary artery disease)     no ischemia nuclear scan 12/2005 EF60% /  nuclear, October, 2012, no ischemia, EF normal  . Atrial flutter     limited post CABG  . Hiatal hernia   . Peptic ulcer disease   . Hypertension   . Hx of CABG     2002  . Ejection fraction     EF. 60%, Nuclear, 2007 /   EF, 60%, nuclear, October, 2012  . Carotid  artery disease     Doppler, October, 2011, 40-59% bilateral  . Weight loss     August, 2012  . Myocardial infarction     1996  . Arthritis     knees and shoulders   . Hearing aid worn     bilateral ears     Past Surgical History  Procedure Date  . Coronary artery bypass graft 2002  . Cardiac catheterization   . Joint replacement     left knee replacement 4/11  . Total knee arthroplasty 05/13/2011    Procedure: TOTAL KNEE ARTHROPLASTY;  Surgeon: Loanne Drilling, MD;  Location: [facility];  Service: Orthopedics;  Laterality: Right;    ROS    Patient denies fever, chills, headache, sweats, rash, change in vision, change in hearing, chest pain, cough, nausea vomiting, urinary symptoms. All other systems are reviewed and are negative.  PHYSICAL EXAM  Patient is oriented to person time and place. Affect is normal. There is no jugulovenous distention. Lungs are clear. Respiratory effort is nonlabored. Cardiac exam reveals S1 and S2. There no clicks or significant murmurs. The abdomen is soft. There is no significant peripheral edema.  There no musculoskeletal deformities. No skin rashes.  Filed Vitals:   01/07/12 1140  BP: 146/65  Pulse: 59  Resp: 17  Height: 6\' 1"  (1.854 m)  Weight: 205 lb 6.4 oz (93.169 kg)  SpO2: 93%   EKG is done today and reviewed by me. There is mild sinus bradycardia. There is decreased R wave in V2. There is no significant change from the past.  ASSESSMENT & PLAN

## 2012-01-07 NOTE — Assessment & Plan Note (Signed)
The patient does have carotid disease that is significant. There was a mild change in April, 2013. Six-month followup is planned.

## 2012-01-07 NOTE — Assessment & Plan Note (Signed)
Coronary disease is stable. He had a nuclear scan 2012 showing no marked ischemia. No further workup.

## 2012-01-07 NOTE — Patient Instructions (Addendum)
**Note De-Identified  Obfuscation** Your physician has recommended you make the following change in your medication: take 81 mg of aspirin daily (all other medications remain the same)  Your physician wants you to follow-up in: 1 year. You will receive a reminder letter in the mail two months in advance. If you don't receive a letter, please call our office to schedule the follow-up appointment.

## 2012-01-07 NOTE — Assessment & Plan Note (Signed)
The patient has normal LV function documented most recently by nuclear scan in October, 2012. No further workup is needed.

## 2012-02-13 ENCOUNTER — Other Ambulatory Visit: Payer: Self-pay | Admitting: Cardiology

## 2012-02-13 DIAGNOSIS — I6529 Occlusion and stenosis of unspecified carotid artery: Secondary | ICD-10-CM

## 2012-02-17 ENCOUNTER — Encounter (INDEPENDENT_AMBULATORY_CARE_PROVIDER_SITE_OTHER): Payer: Medicare Other

## 2012-02-17 DIAGNOSIS — I6529 Occlusion and stenosis of unspecified carotid artery: Secondary | ICD-10-CM

## 2012-02-25 ENCOUNTER — Encounter: Payer: Self-pay | Admitting: Cardiology

## 2012-02-28 ENCOUNTER — Telehealth: Payer: Self-pay | Admitting: Cardiology

## 2012-02-28 NOTE — Telephone Encounter (Signed)
Carotid doppler results were given to pt. 

## 2012-02-28 NOTE — Telephone Encounter (Signed)
Pt rtning your call/lg °

## 2012-03-11 ENCOUNTER — Other Ambulatory Visit: Payer: Self-pay

## 2012-03-11 MED ORDER — SIMVASTATIN 20 MG PO TABS: 20.0000 mg | ORAL_TABLET | Freq: Every day | ORAL | Status: DC

## 2012-03-11 MED ORDER — AMLODIPINE BESYLATE 10 MG PO TABS: 10.0000 mg | ORAL_TABLET | Freq: Every day | ORAL | Status: DC

## 2012-07-03 ENCOUNTER — Ambulatory Visit: Payer: Medicare Other | Admitting: Cardiology

## 2012-07-27 ENCOUNTER — Ambulatory Visit (INDEPENDENT_AMBULATORY_CARE_PROVIDER_SITE_OTHER): Payer: Medicare Other | Admitting: Cardiology

## 2012-07-27 VITALS — BP 136/56 | HR 68 | Ht 73.0 in | Wt 202.0 lb

## 2012-07-27 DIAGNOSIS — I10 Essential (primary) hypertension: Secondary | ICD-10-CM

## 2012-07-27 DIAGNOSIS — I779 Disorder of arteries and arterioles, unspecified: Secondary | ICD-10-CM

## 2012-07-27 DIAGNOSIS — I2510 Atherosclerotic heart disease of native coronary artery without angina pectoris: Secondary | ICD-10-CM

## 2012-07-27 MED ORDER — AMLODIPINE BESYLATE 10 MG PO TABS: 10.0000 mg | ORAL_TABLET | Freq: Every day | ORAL | Status: DC

## 2012-07-27 NOTE — Patient Instructions (Addendum)
Your physician wants you to follow-up in: 1 year. You will receive a reminder letter in the mail two months in advance. If you don't receive a letter, please call our office to schedule the follow-up appointment.  

## 2012-07-27 NOTE — Assessment & Plan Note (Signed)
Coronary disease is stable.  No further workup. 

## 2012-07-27 NOTE — Assessment & Plan Note (Signed)
Blood pressure stable no change in therapy. I'll see him back in one year.

## 2012-07-27 NOTE — Progress Notes (Signed)
HPI   Patient is seen in followup coronary disease. He's doing very well. I saw him last September, 2013. He's been having Dopplers to follow his carotids carefully. This is stable. He's not having any chest pain or shortness of breath.  No Known Allergies  Current Outpatient Prescriptions  Medication Sig Dispense Refill  . aspirin EC 81 MG tablet Take 1 tablet (81 mg total) by mouth daily.  90 tablet  3  . gabapentin (NEURONTIN) 600 MG tablet Take 600 mg by mouth 3 (three) times daily.       . metoprolol (LOPRESSOR) 50 MG tablet Take 25 mg by mouth 3 (three) times daily.       . ranitidine (ZANTAC) 300 MG capsule Take 300 mg by mouth daily.       . simvastatin (ZOCOR) 20 MG tablet Take 1 tablet (20 mg total) by mouth at bedtime.  90 tablet  2  . Sulfapyridine POWD 250 mg by Other route every other day. PT STATES THIS IS CAPSULE BUT COULD NOT FIND AS CAPSULE.      . Tamsulosin HCl (FLOMAX) 0.4 MG CAPS Take 0.4 mg by mouth daily.        Marland Kitchen amLODipine (NORVASC) 10 MG tablet Take 1 tablet (10 mg total) by mouth daily.  30 tablet  6   No current facility-administered medications for this visit.    History   Social History  . Marital Status: Widowed    Spouse Name: N/A    Number of Children: N/A  . Years of Education: N/A   Occupational History  . Not on file.   Social History Main Topics  . Smoking status: Former Games developer  . Smokeless tobacco: Never Used  . Alcohol Use: No  . Drug Use: No  . Sexually Active: Not on file   Other Topics Concern  . Not on file   Social History Narrative  . No narrative on file    No family history on file.  Past Medical History  Diagnosis Date  . Dyslipidemia   . Medication intolerance     NIACIN  . CAD (coronary artery disease)     no ischemia nuclear scan 12/2005 EF60% /  nuclear, October, 2012, no ischemia, EF normal  . Atrial flutter     limited post CABG  . Hiatal hernia   . Peptic ulcer disease   . Hypertension   . Hx of CABG      2002  . Ejection fraction     EF. 60%, Nuclear, 2007 /   EF, 60%, nuclear, October, 2012  . Carotid artery disease     Doppler, October, 2011, 40-59% bilateral  . Weight loss     August, 2012  . Myocardial infarction     1996  . Arthritis     knees and shoulders   . Hearing aid worn     bilateral ears     Past Surgical History  Procedure Laterality Date  . Coronary artery bypass graft  2002  . Cardiac catheterization    . Joint replacement      left knee replacement 4/11  . Total knee arthroplasty  05/13/2011    Procedure: TOTAL KNEE ARTHROPLASTY;  Surgeon: Loanne Drilling, MD;  Location: [facility];  Service: Orthopedics;  Laterality: Right;    Patient Active Problem List  Diagnosis  . Dyslipidemia  . Medication intolerance  . Hx of CABG  . Atrial flutter  . Peptic ulcer disease  . Hypertension  .  Carotid artery disease  . Weight loss  . CAD (coronary artery disease)  . Ejection fraction  . OA (osteoarthritis) of knee  . Postop Acute blood loss anemia  . Postop Transfusion history  . Postop Hyponatremia    ROS   Patient denies fever, chills, headache, sweats, rash, change in vision, change in hearing, chest pain, cough, nausea vomiting, urinary symptoms. All other systems are reviewed and are negative.  PHYSICAL EXAM   Patient is oriented to person time and place. Affect is normal. Lungs are clear. Respiratory effort is nonlabored. Cardiac exam reveals S1 and S2. There no clicks or significant murmurs. The abdomen is soft. Is no peripheral edema.  Filed Vitals:   07/27/12 1630  BP: 136/56  Pulse: 68  Height: 6\' 1"  (1.854 m)  Weight: 202 lb (91.627 kg)   EKG is done today and reviewed by me. There is normal sinus rhythm. There are nonspecific ST-T wave abnormalities. There is no significant change.  ASSESSMENT & PLAN

## 2012-07-27 NOTE — Assessment & Plan Note (Signed)
Carotid disease is stable. He is scheduled to have his next Doppler one year from October of 2013

## 2012-11-09 ENCOUNTER — Other Ambulatory Visit: Payer: Self-pay | Admitting: Cardiology

## 2012-12-07 ENCOUNTER — Other Ambulatory Visit: Payer: Self-pay | Admitting: Cardiology

## 2013-02-01 ENCOUNTER — Other Ambulatory Visit: Payer: Self-pay | Admitting: Cardiology

## 2013-02-18 ENCOUNTER — Encounter (HOSPITAL_COMMUNITY): Payer: Medicare Other

## 2013-02-24 ENCOUNTER — Ambulatory Visit (HOSPITAL_COMMUNITY): Payer: Medicare Other | Attending: Cardiology

## 2013-02-24 DIAGNOSIS — E785 Hyperlipidemia, unspecified: Secondary | ICD-10-CM | POA: Insufficient documentation

## 2013-02-24 DIAGNOSIS — I2510 Atherosclerotic heart disease of native coronary artery without angina pectoris: Secondary | ICD-10-CM | POA: Insufficient documentation

## 2013-02-24 DIAGNOSIS — I6529 Occlusion and stenosis of unspecified carotid artery: Secondary | ICD-10-CM | POA: Insufficient documentation

## 2013-02-24 DIAGNOSIS — I10 Essential (primary) hypertension: Secondary | ICD-10-CM | POA: Insufficient documentation

## 2013-02-24 DIAGNOSIS — I658 Occlusion and stenosis of other precerebral arteries: Secondary | ICD-10-CM | POA: Insufficient documentation

## 2013-02-24 DIAGNOSIS — R0989 Other specified symptoms and signs involving the circulatory and respiratory systems: Secondary | ICD-10-CM | POA: Insufficient documentation

## 2013-02-24 DIAGNOSIS — Z951 Presence of aortocoronary bypass graft: Secondary | ICD-10-CM | POA: Insufficient documentation

## 2013-02-26 ENCOUNTER — Encounter: Payer: Self-pay | Admitting: Cardiology

## 2013-03-02 ENCOUNTER — Ambulatory Visit (INDEPENDENT_AMBULATORY_CARE_PROVIDER_SITE_OTHER): Payer: Medicare Other | Admitting: Cardiology

## 2013-03-02 ENCOUNTER — Encounter: Payer: Self-pay | Admitting: Cardiology

## 2013-03-02 VITALS — BP 116/74 | HR 56 | Ht 73.0 in | Wt 194.0 lb

## 2013-03-02 DIAGNOSIS — I779 Disorder of arteries and arterioles, unspecified: Secondary | ICD-10-CM

## 2013-03-02 DIAGNOSIS — E785 Hyperlipidemia, unspecified: Secondary | ICD-10-CM

## 2013-03-02 DIAGNOSIS — I2510 Atherosclerotic heart disease of native coronary artery without angina pectoris: Secondary | ICD-10-CM

## 2013-03-02 DIAGNOSIS — I10 Essential (primary) hypertension: Secondary | ICD-10-CM

## 2013-03-02 NOTE — Assessment & Plan Note (Signed)
His carotid Dopplers are stable. No change in therapy.
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
## 2013-03-02 NOTE — Assessment & Plan Note (Signed)
Blood pressure is controlled. No change in therapy. 

## 2013-03-02 NOTE — Patient Instructions (Signed)
Your physician recommends that you continue on your current medications as directed. Please refer to the Current Medication list given to you today.  Your physician wants you to follow-up in: 1 year. You will receive a reminder letter in the mail two months in advance. If you don't receive a letter, please call our office to schedule the follow-up appointment.  

## 2013-03-02 NOTE — Assessment & Plan Note (Signed)
The patient is on 20 mg of simvastatin. Over time his LDL level has been at goal with this dose. I have considered the guidelines which suggested I consider higher dose medications. He is stable. He tolerates the medicine well. He's doing well. I feel it is prudent not to change his medicines.

## 2013-03-02 NOTE — Assessment & Plan Note (Signed)
Coronary disease is stable. No further workup needed. I'll see him back in one year.

## 2013-05-03 ENCOUNTER — Other Ambulatory Visit: Payer: Self-pay | Admitting: Cardiology

## 2013-05-27 ENCOUNTER — Ambulatory Visit (INDEPENDENT_AMBULATORY_CARE_PROVIDER_SITE_OTHER): Payer: Medicare Other | Admitting: Neurology

## 2013-05-27 ENCOUNTER — Encounter: Payer: Self-pay | Admitting: Neurology

## 2013-05-27 ENCOUNTER — Encounter (INDEPENDENT_AMBULATORY_CARE_PROVIDER_SITE_OTHER): Payer: Self-pay

## 2013-05-27 VITALS — BP 145/59 | HR 61 | Ht 73.0 in | Wt 203.0 lb

## 2013-05-27 DIAGNOSIS — R202 Paresthesia of skin: Secondary | ICD-10-CM | POA: Insufficient documentation

## 2013-05-27 DIAGNOSIS — R209 Unspecified disturbances of skin sensation: Secondary | ICD-10-CM

## 2013-05-27 DIAGNOSIS — G589 Mononeuropathy, unspecified: Secondary | ICD-10-CM

## 2013-05-27 DIAGNOSIS — G609 Hereditary and idiopathic neuropathy, unspecified: Secondary | ICD-10-CM

## 2013-05-27 NOTE — Patient Instructions (Signed)
I had a long discussion with the patient regarding his paresthesias and my diagnosis of peripheral neuropathy and answered questions. I recommend discontinuing sulphapyridine powder as he has taken it for several years and may possibly cause neuropathy. Increase gabapentin 600 mg 2 tablets in the morning, one in half to known and 2 at night. Check neuropathy panel labs and EMG nerve conduction study. Return for followup in 6 weeks or earlier if necessary.

## 2013-05-27 NOTE — Progress Notes (Signed)
[facility] [address] [place]. [place] [address] [phone]       OFFICE CONSULT NOTE  [name] Date of Birth:  Nov 21, 1929 Medical Record Number:  [id number]   Referring MD:  Dr Doristine Counter  Reason for Referral:  neuropathy HPI: 80 -year-old Caucasian male who's had progressive lower extremity paresthesias, pain, tingling and burning sensation off-and-on for the last 8 years at least. He states he was evaluated by neurologist Dr. Lissa Morales at that time in 2007 and underwent EMG nerve conduction studies as well as lab work but I do not have those records and he states that no definite etiology was found. He has been taking gabapentin 600 times daily for the last one-year the paresthesias have worsened. He denies any weakness in his legs, difficulty with walking, balance problems. He does trip easily and has had a few near falls. He denies any back pain, radicular pain,  t bowel or bladder discomfort. He does have long-standing history of prostatic problems and recurrent bladder infections and has been taking sulfapyridine for more than 8 years every other day. He has no history of diabetes, drinking alcohol or taking too many multivitamins. He has been on statin for cholesterol but for the last one-year period  ROS:   14 system review of systems is positive for tingling, numbness, burning and pain in feet  , insomnia, restless legs, ringing in the ears and constipation. All other systems negative  PMH:  Past Medical History  Diagnosis Date  . Dyslipidemia   . Medication intolerance     NIACIN  . CAD (coronary artery disease)     no ischemia nuclear scan 12/2005 EF60% /  nuclear, October, 2012, no ischemia, EF normal  . Atrial flutter     limited post CABG  . Hiatal hernia   . Peptic ulcer disease   . Hypertension   . Hx of CABG     2002  . Ejection fraction     EF. 60%, Nuclear, 2007 /   EF, 60%, nuclear, October, 2012  . Carotid artery disease     Doppler,  October, 2011, 40-59% bilateral  . Weight loss     August, 2012  . Myocardial infarction     1996  . Arthritis     knees and shoulders   . Hearing aid worn     bilateral ears     Social History:  History   Social History  . Marital Status: Widowed    Spouse Name: N/A    Number of Children: 2  . Years of Education: 8   Occupational History  . retired    Social History Main Topics  . Smoking status: Former Games developer  . Smokeless tobacco: Never Used  . Alcohol Use: No  . Drug Use: No  . Sexual Activity: Not on file   Other Topics Concern  . Not on file   Social History Narrative  . No narrative on file    Medications:   Current Outpatient Prescriptions on File Prior to Visit  Medication Sig Dispense Refill  . amLODipine (NORVASC) 10 MG tablet Take 1 tablet (10 mg total) by mouth daily.  90 tablet  3  . aspirin 81 MG tablet Take 81 mg by mouth daily.      Marland Kitchen gabapentin (NEURONTIN) 600 MG tablet Take 600 mg by mouth 3 (three) times daily.       . metoprolol (LOPRESSOR) 50 MG tablet TAKE ONE-HALF TABLET BY MOUTH THREE TIMES  DAILY  135 tablet  0  . Pyridoxine HCl (VITAMIN B6 PO) Take by mouth daily.      . ranitidine (ZANTAC) 300 MG capsule Take 300 mg by mouth daily.       . simvastatin (ZOCOR) 20 MG tablet TAKE ONE TABLET BY MOUTH AT BEDTIME  90 tablet  3   No current facility-administered medications on file prior to visit.    Allergies:  No Known Allergies  Physical Exam General: well developed, well nourished elderly caucasian male, seated, in no evident distress Head: head normocephalic and atraumatic. Orohparynx benign Neck: supple with no carotid or supraclavicular bruits Cardiovascular: regular rate and rhythm, no murmurs Musculoskeletal: no deformity Skin:  no rash/petichiae Vascular:  Normal pulses all extremities Filed Vitals:   05/27/13 1313  BP: 145/59  Pulse: 61    Neurologic Exam Mental Status: Awake and fully alert. Oriented to place and time.  Recent and remote memory intact. Attention span, concentration and fund of knowledge appropriate. Mood and affect appropriate.  Cranial Nerves: Fundoscopic exam reveals sharp disc margins. Pupils equal, briskly reactive to light. Extraocular movements full without nystagmus. Visual fields full to confrontation. Hearing intact. Facial sensation intact. Face, tongue, palate moves normally and symmetrically.  Motor: Normal bulk and tone. Normal strength in all tested extremity muscles except mild weakness of ankle dorsi flexors and everters in both feet.. Sensory.: Hyperesthesia to touch and pinprick sensation in both feet from ankle down. Impaired position and vibration sense in both feet from ankle down.  Coordination: Rapid alternating movements normal in all extremities. Finger-to-nose and heel-to-shin performed accurately bilaterally. Gait and Station: Arises from chair without difficulty. Stance is normal. Gait demonstrates normal stride length and balance . Able to heel, toe and tandem walk with minimal difficulty.  Reflexes: 1+ and symmetric. Toes downgoing.       ASSESSMENT: [age] year pleasant Caucasian male with eight-year history of progressive paresthesias in feet likely from idiopathic peripheral neuropathy. He apparently has had a detailed workup in the past but I do not have access to those records.    PLAN: I had a long discussion with the patient regarding his paresthesias and my diagnosis of peripheral neuropathy and answered questions. I recommend discontinuing sulphapyridine powder as he has taken it for several years and may possibly cause neuropathy. Increase gabapentin 600 mg 2 tablets in the morning, one in half to known and 2 at night. Check neuropathy panel labs and EMG nerve conduction study. Return for followup in 6 weeks or earlier if necessary.    Note: This document was prepared with digital dictation and possible smart phrase technology. Any transcriptional errors that  result from this process are unintentional.

## 2013-05-31 ENCOUNTER — Ambulatory Visit (INDEPENDENT_AMBULATORY_CARE_PROVIDER_SITE_OTHER): Payer: Medicare Other | Admitting: Neurology

## 2013-05-31 ENCOUNTER — Encounter (INDEPENDENT_AMBULATORY_CARE_PROVIDER_SITE_OTHER): Payer: Self-pay | Admitting: Radiology

## 2013-05-31 DIAGNOSIS — Z0289 Encounter for other administrative examinations: Secondary | ICD-10-CM

## 2013-05-31 DIAGNOSIS — G63 Polyneuropathy in diseases classified elsewhere: Secondary | ICD-10-CM

## 2013-05-31 DIAGNOSIS — G609 Hereditary and idiopathic neuropathy, unspecified: Secondary | ICD-10-CM

## 2013-05-31 LAB — NEUROPATHY PANEL
A/G Ratio: 1.1 (ref 0.7–2.0)
ALPHA 1: 0.3 g/dL (ref 0.1–0.4)
ALPHA 2: 0.8 g/dL (ref 0.4–1.2)
ANGIO CONVERT ENZYME: 33 U/L (ref 14–82)
Albumin ELP: 3.7 g/dL (ref 3.2–5.6)
Anti Nuclear Antibody(ANA): NEGATIVE
BETA: 1.1 g/dL (ref 0.6–1.3)
GLOBULIN, TOTAL: 3.4 g/dL (ref 2.0–4.5)
Gamma Globulin: 1.2 g/dL (ref 0.5–1.6)
RHEUMATOID FACTOR: 12.9 [IU]/mL (ref 0.0–13.9)
SED RATE: 31 mm/h — AB (ref 0–30)
TSH: 3.21 u[IU]/mL (ref 0.450–4.500)
Total Protein: 7.1 g/dL (ref 6.0–8.5)
Vit D, 25-Hydroxy: 25.3 ng/mL — ABNORMAL LOW (ref 30.0–100.0)
Vitamin B-12: 550 pg/mL (ref 211–946)

## 2013-05-31 NOTE — Procedures (Signed)
     HISTORY:  Jerry [name] is an 80 year old gentleman with a history of numbness in the feet associated with burning sensations that has been noted for several months prior to this evaluation. The patient believes that the discomfort in the legs is equal from one side to the next. The patient reports some slight issues with balance, with no recent falls. The patient denies low back pain. The patient is being evaluated for possible peripheral neuropathy.  NERVE CONDUCTION STUDIES:  Nerve conduction studies were performed on the left upper extremity. The distal motor latency for the left median nerve was prolonged, with a normal motor amplitude. The distal motor latency and motor amplitudes for the left ulnar nerve is normal. The F wave latencies and nerve conduction velocities for the left median and ulnar nerves were normal. The left median sensory latency is slightly prolonged, borderline normal left ulnar sensory latency is noted.  Nerve conduction studies were performed on both lower extremities. The study of the right peroneal nerve is unobtainable. The distal motor latency for the left peroneal nerve is normal, with a low motor amplitude. Slowing was seen for this nerve. Studies of the peroneal nerves were done with pick up on the tibialis anterior muscle, revealing slowing bilaterally. The distal motor latencies and motor amplitudes for the posterior tibial nerves were normal bilaterally, with slowing seen for the right posterior tibial nerve, and a normal nerve conduction velocity was seen for the left posterior tibial nerve. The F wave latencies for the peroneal nerves were absent bilaterally, and prolonged for the posterior tibial nerves bilaterally. The peroneal sensory latency on the right was borderline normal, absent on the left.  EMG STUDIES:  EMG study was performed on the left lower extremity:  The tibialis anterior muscle reveals 2 to 5K motor units with decreased recruitment. No  fibrillations or positive waves were seen. The peroneus tertius muscle reveals 2 to 4K motor units with full recruitment. No fibrillations or positive waves were seen. The medial gastrocnemius muscle reveals 1 to 4K motor units with full recruitment. No fibrillations or positive waves were seen. The vastus lateralis muscle reveals 2 to 4K motor units with full recruitment. No fibrillations or positive waves were seen. The iliopsoas muscle reveals 2 to 4K motor units with full recruitment. No fibrillations or positive waves were seen. The biceps femoris muscle (long head) reveals 2 to 4K motor units with full recruitment. No fibrillations or positive waves were seen. The lumbosacral paraspinal muscles were tested at 3 levels, and revealed no abnormalities of insertional activity at all 3 levels tested. There was good relaxation.   IMPRESSION:  Nerve conduction studies done on the left upper extremity and on both lower extremities shows evidence of a mild left carpal tunnel syndrome and evidence of a primarily axonal peripheral neuropathy of moderate severity. EMG evaluation of the left lower extremity shows mild chronic stable signs of denervation below the knee, consistent with a peripheral neuropathy. There is no evidence of an overlying lumbosacral radiculopathy.  Marlan Palau MD 05/31/2013 12:17 PM  [facility] [address] [place], [place] [address]  Phone [phone] Fax [phone]

## 2013-06-09 NOTE — Progress Notes (Signed)
Quick Note:  I gave results to pt. Will forward to Dr. Doristine Counter at [facility]. ______

## 2013-06-10 NOTE — Progress Notes (Signed)
Quick Note:  I called and gave results to pt. He continues with neuropathic numbness and c/o of cold feet. Taking gabapentin 1200mg  am, 600mg  noon, and 1200mg  pm. Pt states small improvement but feet very cold. Made 6 wk appt 07-14-13 and if can adjust medication prior to this. ______

## 2013-07-14 ENCOUNTER — Encounter (INDEPENDENT_AMBULATORY_CARE_PROVIDER_SITE_OTHER): Payer: Self-pay

## 2013-07-14 ENCOUNTER — Encounter: Payer: Self-pay | Admitting: Neurology

## 2013-07-14 ENCOUNTER — Ambulatory Visit (INDEPENDENT_AMBULATORY_CARE_PROVIDER_SITE_OTHER): Payer: Medicare Other | Admitting: Neurology

## 2013-07-14 VITALS — BP 138/71 | HR 62 | Ht 73.0 in | Wt 210.0 lb

## 2013-07-14 DIAGNOSIS — G629 Polyneuropathy, unspecified: Secondary | ICD-10-CM

## 2013-07-14 DIAGNOSIS — G589 Mononeuropathy, unspecified: Secondary | ICD-10-CM

## 2013-07-14 NOTE — Progress Notes (Signed)
[facility] [address] [place]. [place] [address] [phone]       OFFICE Follow Up Visit NOTE  Jerry [name] Date of Birth:  03/24/1930 Medical Record Number:  [id number]   Referring MD:  Dr Tollie Pizza  Reason for Referral:  neuropathy HPI: Update 07/14/2013 : He returns for followup of the last visit 6 weeks ago. He reports some improvement in his procedures after increasing the dose of the Augmentin. He however has not stopped sulfadiazine despite being asked to do so as he feels he has taken this medicine since 1968 and his dermatologist prescribed as to him for some rash. He however is within to discuss this with Dr. Nevada Crane his dermatologist to see if he  has somewhat better medications to replace this with. He underwent EMG nerve conduction study performed by Dr. Juleen China which confirmed axonal peripheral neuropathy of moderate severity. There was also mild left carpal tunnel. Lab work horn showed normal vitamin B12, serum protein electrophoresis, TSH, ANA, rheumatoid factor and angiotensin-converting enzyme. Vitamin D was slightly low at 24.3 and ESR was  borderline at 31. Patient was advised to discuss low vitamin D. with primary physician but has not yet done so. Initial Consult  05/27/2013 : 80 -year-old Caucasian male who's had progressive lower extremity paresthesias, pain, tingling and burning sensation off-and-on for the last 8 years at least. He states he was evaluated by neurologist Dr. Ron Parker at that time in 2007 and underwent EMG nerve conduction studies as well as lab work but I do not have those records and he states that no definite etiology was found. He has been taking gabapentin 600 times daily for the last one-year the paresthesias have worsened. He denies any weakness in his legs, difficulty with walking, balance problems. He does trip easily and has had a few near falls. He denies any back pain, radicular pain,  t bowel or bladder discomfort. He does have  long-standing history of prostatic problems and recurrent bladder infections and has been taking sulfapyridine for more than 8 years every other day. He has no history of diabetes, drinking alcohol or taking too many multivitamins. He has been on statin for cholesterol but for the last one-year period  ROS:   14 system review of systems is positive for tingling, numbness, burning and pain in feet , hearing loss, ringing in the ears, restless legs. All other systems negative  PMH:  Past Medical History  Diagnosis Date  . Dyslipidemia   . Medication intolerance     NIACIN  . CAD (coronary artery disease)     no ischemia nuclear scan 12/2005 EF60% /  nuclear, October, 2012, no ischemia, EF normal  . Atrial flutter     limited post CABG  . Hiatal hernia   . Peptic ulcer disease   . Hypertension   . Hx of CABG     2002  . Ejection fraction     EF. 60%, Nuclear, 2007 /   EF, 60%, nuclear, October, 2012  . Carotid artery disease     Doppler, October, 2011, 40-59% bilateral  . Weight loss     August, 2012  . Myocardial infarction     1996  . Arthritis     knees and shoulders   . Hearing aid worn     bilateral ears     Social History:  History   Social History  . Marital Status: Widowed    Spouse Name: N/A    Number of  Children: 2  . Years of Education: 8   Occupational History  . retired    Social History Main Topics  . Smoking status: Former Research scientist (life sciences)  . Smokeless tobacco: Never Used  . Alcohol Use: No  . Drug Use: No  . Sexual Activity: Not on file   Other Topics Concern  . Not on file   Social History Narrative   Patient lives at home alone     Medications:   Current Outpatient Prescriptions on File Prior to Visit  Medication Sig Dispense Refill  . amLODipine (NORVASC) 10 MG tablet Take 1 tablet (10 mg total) by mouth daily.  90 tablet  3  . aspirin 81 MG tablet Take 81 mg by mouth daily.      Marland Kitchen gabapentin (NEURONTIN) 600 MG tablet Take 600 mg by mouth 3  (three) times daily.       . metoprolol (LOPRESSOR) 50 MG tablet TAKE ONE-HALF TABLET BY MOUTH THREE TIMES DAILY  135 tablet  0  . Pyridoxine HCl (VITAMIN B6 PO) Take by mouth daily.      . ranitidine (ZANTAC) 300 MG capsule Take 300 mg by mouth daily.       . simvastatin (ZOCOR) 20 MG tablet TAKE ONE TABLET BY MOUTH AT BEDTIME  90 tablet  3   No current facility-administered medications on file prior to visit.    Allergies:  No Known Allergies  Physical Exam General: well developed, well nourished elderly caucasian male, seated, in no evident distress Head: head normocephalic and atraumatic. Orohparynx benign Neck: supple with no carotid or supraclavicular bruits Cardiovascular: regular rate and rhythm, no murmurs Musculoskeletal: no deformity Skin:  no rash/petichiae Vascular:  Normal pulses all extremities Filed Vitals:   07/14/13 1518  BP: 138/71  Pulse: 62    Neurologic Exam Mental Status: Awake and fully alert. Oriented to place and time. Recent and remote memory intact. Attention span, concentration and fund of knowledge appropriate. Mood and affect appropriate.  Cranial Nerves: Fundoscopic exam reveals sharp disc margins. Pupils equal, briskly reactive to light. Extraocular movements full without nystagmus. Visual fields full to confrontation. Hearing intact. Facial sensation intact. Face, tongue, palate moves normally and symmetrically.  Motor: Normal bulk and tone. Normal strength in all tested extremity muscles except mild weakness of ankle dorsi flexors and everters in both feet.. Sensory.: Hyperesthesia to touch and pinprick sensation in both feet from ankle down. Impaired position and vibration sense in both feet from ankle down.  Coordination: Rapid alternating movements normal in all extremities. Finger-to-nose and heel-to-shin performed accurately bilaterally. Gait and Station: Arises from chair without difficulty. Stance is normal. Gait demonstrates normal stride  length and balance . Able to heel, toe and tandem walk with minimal difficulty.  Reflexes: 1+ and symmetric. Toes downgoing.       ASSESSMENT: 80 year pleasant Caucasian male with eight-year history of progressive paresthesias in feet likely from idiopathic peripheral neuropathy. He apparently has had a detailed workup in the past  Which was unyielding and may perhaps be related to long term usage of sulphadiazine   PLAN: I had along discussion with the patient and discussed the results of his lab work, nerve conduction EMG study and encouraged him to discontinue sulfadiazine has long-term usage can cause neuropathy. He seems quite reluctant to do so since he has been taking it since 1968. I advised him to discuss this with his dermatologist Dr. Nevada Crane to look for alternative medications  If possible. He was also advised to  see his primary physician Dr. Tollie Pizza to discuss treatment and evaluation for low vitamin D. levels. He was advised to increase the gabapentin to 1200 mg 3 times daily to help with his paresthesias. He was advised to return for followup in 3 months with Charlott Holler, NP.or call earlier., if necessary    Note: This document was prepared with digital dictation and possible smart phrase technology. Any transcriptional errors that result from this process are unintentional.

## 2013-07-14 NOTE — Patient Instructions (Signed)
I had along discussion with the patient and discussed the results of his lab work, nerve conduction EMG study and encouraged him to discontinue sulfadiazine has long-term usage can cause neuropathy. He seems quite reluctant to do so since he has been taking it since 1968. I advised him to discuss this with his dermatologist Dr. Margo Aye to look for Trental medications possible. He was also advised to see his primary physician Dr. Electa Sniff to discuss treatment and evaluation for low vitamin D. levels. He was advised to increase the gabapentin to 1200 mg 3 times daily to help with his paresthesias. He was advised to return for followup in 3 months with Heide Guile, n.p.or call earlier., if necessary

## 2013-08-03 ENCOUNTER — Encounter: Payer: Self-pay | Admitting: Cardiology

## 2013-08-03 ENCOUNTER — Ambulatory Visit (INDEPENDENT_AMBULATORY_CARE_PROVIDER_SITE_OTHER): Payer: Medicare Other | Admitting: Cardiology

## 2013-08-03 VITALS — BP 134/60 | HR 70 | Ht 73.0 in | Wt 209.8 lb

## 2013-08-03 DIAGNOSIS — I779 Disorder of arteries and arterioles, unspecified: Secondary | ICD-10-CM

## 2013-08-03 DIAGNOSIS — I739 Peripheral vascular disease, unspecified: Secondary | ICD-10-CM

## 2013-08-03 DIAGNOSIS — I2510 Atherosclerotic heart disease of native coronary artery without angina pectoris: Secondary | ICD-10-CM

## 2013-08-03 NOTE — Progress Notes (Signed)
Patient ID: Jerry [name], male   DOB: April 21, 1930, 80 y.o.   MRN: [id number]    HPI  Patient is seen today to followup coronary disease. He's actually doing very well. He is being treated for neuropathy by neurology. His overall cardiac status is stable. His carotids are followed by Doppler. He should have a followup in October, 2015.  No Known Allergies  Current Outpatient Prescriptions  Medication Sig Dispense Refill  . amLODipine (NORVASC) 10 MG tablet Take 1 tablet (10 mg total) by mouth daily.  90 tablet  3  . aspirin 81 MG tablet Take 81 mg by mouth daily.      Marland Kitchen gabapentin (NEURONTIN) 600 MG tablet Take 600 mg by mouth 3 (three) times daily.       . metoprolol (LOPRESSOR) 50 MG tablet TAKE ONE-HALF TABLET BY MOUTH THREE TIMES DAILY  135 tablet  0  . Pyridoxine HCl (VITAMIN B6 PO) Take by mouth daily.      . ranitidine (ZANTAC) 300 MG capsule Take 300 mg by mouth daily.       . simvastatin (ZOCOR) 20 MG tablet TAKE ONE TABLET BY MOUTH AT BEDTIME  90 tablet  3  . Sulfapyridine POWD by Does not apply route as directed.       No current facility-administered medications for this visit.    History   Social History  . Marital Status: Widowed    Spouse Name: N/A    Number of Children: 2  . Years of Education: 8   Occupational History  . retired    Social History Main Topics  . Smoking status: Former Games developer  . Smokeless tobacco: Never Used  . Alcohol Use: No  . Drug Use: No  . Sexual Activity: Not on file   Other Topics Concern  . Not on file   Social History Narrative   Patient lives at home alone     No family history on file.  Past Medical History  Diagnosis Date  . Dyslipidemia   . Medication intolerance     NIACIN  . CAD (coronary artery disease)     no ischemia nuclear scan 12/2005 EF60% /  nuclear, October, 2012, no ischemia, EF normal  . Atrial flutter     limited post CABG  . Hiatal hernia   . Peptic ulcer disease   . Hypertension   . Hx of CABG    2002  . Ejection fraction     EF. 60%, Nuclear, 2007 /   EF, 60%, nuclear, October, 2012  . Carotid artery disease     Doppler, October, 2011, 40-59% bilateral  . Weight loss     August, 2012  . Myocardial infarction     1996  . Arthritis     knees and shoulders   . Hearing aid worn     bilateral ears     Past Surgical History  Procedure Laterality Date  . Coronary artery bypass graft  2002  . Cardiac catheterization    . Joint replacement      left knee replacement 4/11  . Total knee arthroplasty  05/13/2011    Procedure: TOTAL KNEE ARTHROPLASTY;  Surgeon: Loanne Drilling, MD;  Location: [facility]S;  Service: Orthopedics;  Laterality: Right;    Patient Active Problem List   Diagnosis Date Noted  . Ejection fraction     Priority: High  . Idiopathic neuropathy 05/27/2013  . Tingling in extremities 05/27/2013  . Postop Transfusion history 05/16/2011  . Postop  Hyponatremia 05/16/2011  . Postop Acute blood loss anemia 05/14/2011  . OA (osteoarthritis) of knee 05/13/2011  . CAD (coronary artery disease)   . Weight loss   . Dyslipidemia   . Medication intolerance   . Hx of CABG   . Atrial flutter   . Peptic ulcer disease   . Hypertension   . Carotid artery disease     ROS    Patient denies fever, chills, headache, sweats, rash, change in vision, change in hearing, chest pain, cough, nausea vomiting, urinary symptoms. All other systems are reviewed and are negative.  PHYSICAL EXAM  Patient is oriented to person time and place. Affect is normal. There is no jugulovenous distention. Lungs are clear. Respiratory effort is nonlabored. Cardiac exam reveals S1 and S2. The abdomen is soft. Is no peripheral edema.  Filed Vitals:   08/03/13 0924  BP: 134/60  Pulse: 70  Height: 6\' 1"  (1.854 m)  Weight: 209 lb 12.8 oz (95.165 kg)   EKG is done today and reviewed by me. There are nonspecific ST-T wave changes. This is unchanged when compared to the prior tracing.  ASSESSMENT &  PLAN

## 2013-08-03 NOTE — Assessment & Plan Note (Signed)
Carotids are being followed carefully. No change in therapy. 

## 2013-08-03 NOTE — Patient Instructions (Signed)
**Note De-identified  Obfuscation** Your physician recommends that you continue on your current medications as directed. Please refer to the Current Medication list given to you today.  Your physician wants you to follow-up in: 1 year. You will receive a reminder letter in the mail two months in advance. If you don't receive a letter, please call our office to schedule the follow-up appointment.  

## 2013-08-03 NOTE — Assessment & Plan Note (Signed)
Coronary disease is stable. He needs no further workup. I can see him back in one year.

## 2013-08-09 ENCOUNTER — Other Ambulatory Visit: Payer: Self-pay | Admitting: Cardiology

## 2013-09-09 ENCOUNTER — Ambulatory Visit: Payer: Medicare Other | Admitting: Neurology

## 2013-09-27 ENCOUNTER — Other Ambulatory Visit: Payer: Self-pay | Admitting: Cardiology

## 2013-10-26 ENCOUNTER — Ambulatory Visit (INDEPENDENT_AMBULATORY_CARE_PROVIDER_SITE_OTHER): Payer: Medicare Other | Admitting: Nurse Practitioner

## 2013-10-26 ENCOUNTER — Encounter: Payer: Self-pay | Admitting: Nurse Practitioner

## 2013-10-26 VITALS — BP 141/62 | HR 55 | Temp 97.9°F | Ht 73.0 in | Wt 216.0 lb

## 2013-10-26 DIAGNOSIS — G609 Hereditary and idiopathic neuropathy, unspecified: Secondary | ICD-10-CM

## 2013-10-26 DIAGNOSIS — I2510 Atherosclerotic heart disease of native coronary artery without angina pectoris: Secondary | ICD-10-CM

## 2013-10-26 DIAGNOSIS — G589 Mononeuropathy, unspecified: Secondary | ICD-10-CM

## 2013-10-26 MED ORDER — GABAPENTIN 600 MG PO TABS: 1200.0000 mg | ORAL_TABLET | Freq: Three times a day (TID) | ORAL | Status: DC

## 2013-10-26 NOTE — Patient Instructions (Signed)
Continue the gabapentin 1200 mg 3 times daily to help with paresthesias.  Return for followup in 6 months or call earlier if necessary.   Peripheral Neuropathy Peripheral neuropathy is a type of nerve damage. It affects nerves that carry signals between the spinal cord and other parts of the body. These are called peripheral nerves. With peripheral neuropathy, one nerve or a group of nerves may be damaged.  CAUSES  Many things can damage peripheral nerves. For some people with peripheral neuropathy, the cause is unknown. Some causes include:  Diabetes. This is the most common cause of peripheral neuropathy.  Injury to a nerve.  Pressure or stress on a nerve that lasts a long time.  Too little vitamin B. Alcoholism can lead to this.  Infections.  Autoimmune diseases, such as multiple sclerosis and systemic lupus erythematosus.  Inherited nerve diseases.  Some medicines, such as cancer drugs.  Toxic substances, such as lead and mercury.  Too little blood flowing to the legs.  Kidney disease.  Thyroid disease. SIGNS AND SYMPTOMS  Different people have different symptoms. The symptoms you have will depend on which of your nerves is damaged. Common symptoms include:  Loss of feeling (numbness) in the feet and hands.  Tingling in the feet and hands.  Pain that burns.  Very sensitive skin.  Weakness.  Not being able to move a part of the body (paralysis).  Muscle twitching.  Clumsiness or poor coordination.  Loss of balance.  Not being able to control your bladder.  Feeling dizzy.  Sexual problems. DIAGNOSIS  Peripheral neuropathy is a symptom, not a disease. Finding the cause of peripheral neuropathy can be hard. To figure that out, your health care provider will take a medical history and do a physical exam. A neurological exam will also be done. This involves checking things affected by your brain, spinal cord, and nerves (nervous system). For example, your  health care provider will check your reflexes, how you move, and what you can feel.  Other types of tests may also be ordered, such as:  Blood tests.  A test of the fluid in your spinal cord.  Imaging tests, such as CT scans or an MRI.  Electromyography (EMG). This test checks the nerves that control muscles.  Nerve conduction velocity tests. These tests check how fast messages pass through your nerves.  Nerve biopsy. A small piece of nerve is removed. It is then checked under a microscope. TREATMENT   Medicine is often used to treat peripheral neuropathy. Medicines may include:  Pain-relieving medicines. Prescription or over-the-counter medicine may be suggested.  Antiseizure medicine. This may be used for pain.  Antidepressants. These also may help ease pain from neuropathy.  Lidocaine. This is a numbing medicine. You might wear a patch or be given a shot.  Mexiletine. This medicine is typically used to help control irregular heart rhythms.  Surgery. Surgery may be needed to relieve pressure on a nerve or to destroy a nerve that is causing pain.  Physical therapy to help movement.  Assistive devices to help movement. HOME CARE INSTRUCTIONS   Only take over-the-counter or prescription medicines as directed by your health care provider. Follow the instructions carefully for any given medicines. Do not take any other medicines without first getting approval from your health care provider.  If you have diabetes, work closely with your health care provider to keep your blood sugar under control.  If you have numbness in your feet:  Check every day for signs  of injury or infection. Watch for redness, warmth, and swelling.  Wear padded socks and comfortable shoes. These help protect your feet.  Do not do things that put pressure on your damaged nerve.  Do not smoke. Smoking keeps blood from getting to damaged nerves.  Avoid or limit alcohol. Too much alcohol can cause a  lack of B vitamins. These vitamins are needed for healthy nerves.  Develop a good support system. Coping with peripheral neuropathy can be stressful. Talk to a mental health specialist or join a support group if you are struggling.  Follow up with your health care provider as directed. SEEK MEDICAL CARE IF:   You have new signs or symptoms of peripheral neuropathy.  You are struggling emotionally from dealing with peripheral neuropathy.  You have a fever. SEEK IMMEDIATE MEDICAL CARE IF:   You have an injury or infection that is not healing.  You feel very dizzy or begin vomiting.  You have chest pain.  You have trouble breathing. Document Released: 04/05/2002 Document Revised: 12/26/2010 Document Reviewed: 12/21/2012 [facility] Patient Information 2015 [employer], [place]. This information is not intended to replace advice given to you by your health care provider. Make sure you discuss any questions you have with your health care provider.

## 2013-10-26 NOTE — Progress Notes (Signed)
PATIENT: Jerry [name] DOB: [birth year]  REASON FOR VISIT: routine follow up for neuropathy HISTORY FROM: patient  HISTORY OF PRESENT ILLNESS: Update 10/26/13 (LL): Mr. Gildersleeve returns for a 3 month visit.  His neuropathy symptoms are much improved since taking 1200 mg tid.  He does not notice any side effects from this dose and is pleased with the treatment.  He continues to take Sulfapyridine only when his Dermatitis herpetiformis flares up. He applies the powder topically to the skin.  He has not discussed his low vitamin D level with his PCP.  Update 07/14/2013 (PS): He returns for followup of the last visit 6 weeks ago. He reports some improvement in his procedures after increasing the dose of the Gabapentin. He however has not stopped sulfadiazine despite being asked to do so as he feels he has taken this medicine since 1968 and his dermatologist prescribed as to him for some rash. He however is within to discuss this with Dr. Nevada Crane his dermatologist to see if he has somewhat better medications to replace this with. He underwent EMG nerve conduction study performed by Dr. Jannifer Franklin which confirmed axonal peripheral neuropathy of moderate severity. There was also mild left carpal tunnel. Lab work showed normal vitamin B12, serum protein electrophoresis, TSH, ANA, rheumatoid factor and angiotensin-converting enzyme. Vitamin D was slightly low at 24.3 and ESR was borderline at 31. Patient was advised to discuss low vitamin D. with primary physician but has not yet done so.   Initial Consult 05/27/2013 (PS): 80 -year-old Caucasian male who's had progressive lower extremity paresthesias, pain, tingling and burning sensation off-and-on for the last 8 years at least. He states he was evaluated by neurologist Dr. Ron Parker at that time in 2007 and underwent EMG nerve conduction studies as well as lab work but I do not have those records and he states that no definite etiology was found. He has been taking gabapentin  600 times daily for the last one-year the paresthesias have worsened. He denies any weakness in his legs, difficulty with walking, balance problems. He does trip easily and has had a few near falls. He denies any back pain, radicular pain, t bowel or bladder discomfort. He does have long-standing history of prostatic problems and recurrent bladder infections and has been taking sulfapyridine for more than 8 years every other day. He has no history of diabetes, drinking alcohol or taking too many multivitamins. He has been on statin for cholesterol but for the last one-year period   ROS:  14 system review of systems is positive for hearing loss, ringing in the ears, restless legs and back pain.. All other systems negative.  ALLERGIES: No Known Allergies HOME MEDICATIONS: Outpatient Prescriptions Prior to Visit  Medication Sig Dispense Refill  . amLODipine (NORVASC) 10 MG tablet TAKE ONE TABLET BY MOUTH ONCE DAILY.  90 tablet  2  . aspirin 81 MG tablet Take 81 mg by mouth daily.      . metoprolol (LOPRESSOR) 50 MG tablet TAKE ONE-HALF TABLET BY MOUTH THREE TIMES DAILY  135 tablet  1  . Pyridoxine HCl (VITAMIN B6 PO) Take by mouth daily.      . ranitidine (ZANTAC) 300 MG capsule Take 300 mg by mouth daily.       . simvastatin (ZOCOR) 20 MG tablet TAKE ONE TABLET BY MOUTH AT BEDTIME  90 tablet  3  . Sulfapyridine POWD by Does not apply route as directed.      . gabapentin (NEURONTIN) 600 MG  tablet Take 600 mg by mouth 3 (three) times daily.        No facility-administered medications prior to visit.    PHYSICAL EXAM Filed Vitals:   10/26/13 1327  BP: 141/62  Pulse: 55  Temp: 97.9 F (36.6 C)  TempSrc: Oral  Height: _0  (1.854 m)  Weight: 216 lb (97.977 kg)   Body mass index is 28.5 kg/(m^2). No exam data present  Neck: supple with no carotid or supraclavicular bruits  Cardiovascular: regular rate and rhythm, no murmurs  Musculoskeletal: no deformity  Skin: no rash/petichiae    Vascular: Normal pulses all extremities   Neurologic Exam  Mental Status: Awake and fully alert. Oriented to place and time. Recent and remote memory intact. Attention span, concentration and fund of knowledge appropriate. Mood and affect appropriate.  Cranial Nerves: Fundoscopic exam not done. Pupils equal, briskly reactive to light. Extraocular movements full without nystagmus. Visual fields full to confrontation. Hearing intact. Facial sensation intact. Face, tongue, palate moves normally and symmetrically.  Motor: Normal bulk and tone. Normal strength in all tested extremity muscles except mild weakness of ankle dorsi flexors and everters in both feet. Sensory: Hyperesthesia to touch and pinprick sensation in both feet from ankle down. Impaired position and vibration sense in both feet from ankle down.  Coordination: Rapid alternating movements normal in all extremities. Finger-to-nose and heel-to-shin performed accurately bilaterally.  Gait and Station: Arises from chair without difficulty. Stance is normal. Gait demonstrates normal stride length and balance . Able to heel, toe and tandem walk with minimal difficulty.  Reflexes: 1+ and symmetric. Toes downgoing.   ASSESSMENT: 87 year pleasant Caucasian male with eight-year history of progressive paresthesias in feet likely from idiopathic peripheral neuropathy. He apparently has had a detailed workup in the past Which was unyielding and may perhaps be related to long term usage of sulphadiazine.  PLAN:  He was also advised to see his primary physician Dr. Tollie Pizza to discuss treatment and evaluation for low vitamin D levels.  Continue the gabapentin 1200 mg 3 times daily to help with his paresthesias.  He was advised to return for followup in 6 months or call earlier if necessary.   Meds ordered this encounter  Medications  . gabapentin (NEURONTIN) 600 MG tablet    Sig: Take 2 tablets (1,200 mg total) by mouth 3 (three) times daily.     Dispense:  540 tablet    Refill:  3    Please disregard previous order, he needs 90 day supply.    Order Specific Question:  Supervising Gevon Markus    Answer:  Garvin Fila [[id number]]   Return in about 6 months (around 04/27/2014) for neuropathy.  Philmore Pali, MSN, NP-C 10/26/2013, 1:54 PM [facility] [address] [place], [place] [address] [phone]  Note: This document was prepared with digital dictation and possible smart phrase technology. Any transcriptional errors that result from this process are unintentional.

## 2013-12-06 ENCOUNTER — Other Ambulatory Visit: Payer: Self-pay | Admitting: Cardiology

## 2014-01-26 ENCOUNTER — Other Ambulatory Visit (HOSPITAL_COMMUNITY): Payer: Self-pay | Admitting: *Deleted

## 2014-01-26 DIAGNOSIS — I6529 Occlusion and stenosis of unspecified carotid artery: Secondary | ICD-10-CM

## 2014-01-31 ENCOUNTER — Other Ambulatory Visit: Payer: Self-pay | Admitting: Cardiology

## 2014-02-23 ENCOUNTER — Ambulatory Visit (HOSPITAL_COMMUNITY): Payer: Medicare Other | Attending: Internal Medicine | Admitting: *Deleted

## 2014-02-23 DIAGNOSIS — Z951 Presence of aortocoronary bypass graft: Secondary | ICD-10-CM | POA: Diagnosis not present

## 2014-02-23 DIAGNOSIS — E785 Hyperlipidemia, unspecified: Secondary | ICD-10-CM | POA: Diagnosis not present

## 2014-02-23 DIAGNOSIS — I2510 Atherosclerotic heart disease of native coronary artery without angina pectoris: Secondary | ICD-10-CM | POA: Diagnosis present

## 2014-02-23 DIAGNOSIS — I6523 Occlusion and stenosis of bilateral carotid arteries: Secondary | ICD-10-CM

## 2014-02-23 DIAGNOSIS — I10 Essential (primary) hypertension: Secondary | ICD-10-CM | POA: Insufficient documentation

## 2014-02-23 NOTE — Progress Notes (Signed)
Carotid duplex complete 

## 2014-03-16 ENCOUNTER — Encounter: Payer: Self-pay | Admitting: Neurology

## 2014-03-22 ENCOUNTER — Encounter: Payer: Self-pay | Admitting: Neurology

## 2014-04-20 ENCOUNTER — Ambulatory Visit: Payer: Medicare Other | Admitting: Nurse Practitioner

## 2014-06-17 ENCOUNTER — Ambulatory Visit: Payer: Medicare Other | Admitting: Neurology

## 2014-07-01 ENCOUNTER — Ambulatory Visit (INDEPENDENT_AMBULATORY_CARE_PROVIDER_SITE_OTHER): Payer: Medicare Other | Admitting: Adult Health

## 2014-07-01 ENCOUNTER — Encounter: Payer: Self-pay | Admitting: Adult Health

## 2014-07-01 VITALS — BP 138/62 | HR 60 | Ht 73.0 in | Wt 214.0 lb

## 2014-07-01 DIAGNOSIS — G609 Hereditary and idiopathic neuropathy, unspecified: Secondary | ICD-10-CM

## 2014-07-01 NOTE — Progress Notes (Signed)
PATIENT: Jerry [name] DOB: 08-04-1929  REASON FOR VISIT: follow up- peripheral neuropathy HISTORY FROM: patient  HISTORY OF PRESENT ILLNESS: Jerry [name] is an 81 year old male with a history of peripheral neuropathy. He returns today for follow-up. He is currently taking gabapentin 1200 mg 3 times a day. He reports that this has been beneficial for the burning and tingling pain. He states that the burning and tingling usually extends to the mid calf on both legs. He states that he is sleeping well at night. He does state that his feet and toes remain cold. He  states that he checks the bottom of his feet regularly. He denies any new neurological symptoms. The patient does have a hip replacement planned for May 18.  HISTORY:Jerry [name] returns for a 3 month visit. His neuropathy symptoms are much improved since taking 1200 mg tid. He does not notice any side effects from this dose and is pleased with the treatment. He continues to take Sulfapyridine only when his Dermatitis herpetiformis flares up. He applies the powder topically to the skin. He has not discussed his low vitamin D level with his PCP.  Update 07/14/2013 (PS): He returns for followup of the last visit 6 weeks ago. He reports some improvement in his procedures after increasing the dose of the Gabapentin. He however has not stopped sulfadiazine despite being asked to do so as he feels he has taken this medicine since 1968 and his dermatologist prescribed as to him for some rash. He however is within to discuss this with Dr. Nevada Crane his dermatologist to see if he has somewhat better medications to replace this with. He underwent EMG nerve conduction study performed by Dr. Jannifer Franklin which confirmed axonal peripheral neuropathy of moderate severity. There was also mild left carpal tunnel. Lab work showed normal vitamin B12, serum protein electrophoresis, TSH, ANA, rheumatoid factor and angiotensin-converting enzyme. Vitamin D was slightly low at  24.3 and ESR was borderline at 31. Patient was advised to discuss low vitamin D. with primary physician but has not yet done so.   Initial Consult 05/27/2013 (PS): 31 -year-old Caucasian male who's had progressive lower extremity paresthesias, pain, tingling and burning sensation off-and-on for the last 8 years at least. He states he was evaluated by neurologist Dr. Ron Parker at that time in 2007 and underwent EMG nerve conduction studies as well as lab work but I do not have those records and he states that no definite etiology was found. He has been taking gabapentin 600 times daily for the last one-year the paresthesias have worsened. He denies any weakness in his legs, difficulty with walking, balance problems. He does trip easily and has had a few near falls. He denies any back pain, radicular pain, t bowel or bladder discomfort. He does have long-standing history of prostatic problems and recurrent bladder infections and has been taking sulfapyridine for more than 8 years every other day. He has no history of diabetes, drinking alcohol or taking too many multivitamins. He has been on statin for cholesterol but for the last one-year period   REVIEW OF SYSTEMS: Out of a complete 14 system review of symptoms, the patient complains only of the following symptoms, and all other reviewed systems are negative.  See history of present illness  ALLERGIES: No Known Allergies  HOME MEDICATIONS: Outpatient Prescriptions Prior to Visit  Medication Sig Dispense Refill  . amLODipine (NORVASC) 10 MG tablet TAKE ONE TABLET BY MOUTH ONCE DAILY. 90 tablet 2  . aspirin 81  MG tablet Take 81 mg by mouth daily.    Marland Kitchen gabapentin (NEURONTIN) 600 MG tablet Take 2 tablets (1,200 mg total) by mouth 3 (three) times daily. 540 tablet 3  . metoprolol (LOPRESSOR) 50 MG tablet TAKE ONE-HALF TABLET BY MOUTH THREE TIMES DAILY 135 tablet 1  . Pyridoxine HCl (VITAMIN B6 PO) Take by mouth daily.    . ranitidine (ZANTAC) 300 MG  capsule Take 300 mg by mouth daily.     . simvastatin (ZOCOR) 20 MG tablet TAKE ONE TABLET BY MOUTH AT BEDTIME 90 tablet 1  . Sulfapyridine POWD by Does not apply route as directed.     No facility-administered medications prior to visit.    PAST MEDICAL HISTORY: Past Medical History  Diagnosis Date  . Dyslipidemia   . Medication intolerance     NIACIN  . CAD (coronary artery disease)     no ischemia nuclear scan 12/2005 EF60% /  nuclear, October, 2012, no ischemia, EF normal  . Atrial flutter     limited post CABG  . Hiatal hernia   . Peptic ulcer disease   . Hypertension   . Hx of CABG     2002  . Ejection fraction     EF. 60%, Nuclear, 2007 /   EF, 60%, nuclear, October, 2012  . Carotid artery disease     Doppler, October, 2011, 40-59% bilateral  . Weight loss     August, 2012  . Myocardial infarction     1996  . Arthritis     knees and shoulders   . Hearing aid worn     bilateral ears     PAST SURGICAL HISTORY: Past Surgical History  Procedure Laterality Date  . Coronary artery bypass graft  2002  . Cardiac catheterization    . Joint replacement      left knee replacement 4/11  . Total knee arthroplasty  05/13/2011    Procedure: TOTAL KNEE ARTHROPLASTY;  Surgeon: Gearlean Alf, MD;  Location: [facility];  Service: Orthopedics;  Laterality: Right;    FAMILY HISTORY: Family History  Problem Relation Age of Onset  . Stroke Mother   . Stroke Father    PHYSICAL EXAM  Filed Vitals:   07/01/14 0836  BP: 138/62  Pulse: 60  Height: '6\' 1"'  (1.854 m)  Weight: 214 lb (97.07 kg)   Body mass index is 28.24 kg/(m^2).  Generalized: Well developed, in no acute distress   Neurological examination  Mentation: Alert oriented to time, place, history taking. Follows all commands speech and language fluent Cranial nerve II-XII: Pupils were equal round reactive to light. Extraocular movements were full, visual field were full on confrontational test. Facial sensation and  strength were normal. Uvula tongue midline. Head turning and shoulder shrug  were normal and symmetric. Motor: The motor testing reveals 5 over 5 strength of all 4 extremities. Good symmetric motor tone is noted throughout.  Sensory: Sensory testing is intact to soft touch on all 4 extremities. Pinprick decreased in the feet extends in a stocking type pattern bilaterally. Vibration sensation intact on both feet but decreased in the left. No evidence of extinction is noted.  Coordination: Cerebellar testing reveals good finger-nose-finger and heel-to-shin bilaterally.  Gait and station: Patient has a slight limp on the right due to his hip. Tandem gait is slightly unsteady Romberg is negative. No drift is seen.  Reflexes: Deep tendon reflexes are symmetric but depressed throughout  .   DIAGNOSTIC DATA (LABS, IMAGING, TESTING) - I  reviewed patient records, labs, notes, testing and imaging myself where available.      ASSESSMENT AND PLAN 81 y.o. year old male  has a past medical history of Dyslipidemia; Medication intolerance; CAD (coronary artery disease); Atrial flutter; Hiatal hernia; Peptic ulcer disease; Hypertension; CABG; Ejection fraction; Carotid artery disease; Weight loss; Myocardial infarction; Arthritis; and Hearing aid worn. here with:  1. Peripheral neuropathy  Overall the patient is doing well. He will continue gabapentin 1200 mg 3 times a day. If the burning and tingling in his feet get worse he will let us know. He will follow-up in one year or sooner if needed.  Ward Givens, MSN, NP-C 07/01/2014, 8:45 AM [facility] [address], [place], [place] [address] [phone]  Note: This document was prepared with digital dictation and possible smart phrase technology. Any transcriptional errors that result from this process are unintentional.

## 2014-07-01 NOTE — Patient Instructions (Signed)
Continue Gabapentin.  If burning and tingling gets worse please call and let us know.

## 2014-07-02 NOTE — Progress Notes (Signed)
I have read the note, and I agree with the clinical assessment and plan.  Lynel Forester KEITH   

## 2014-07-07 ENCOUNTER — Ambulatory Visit: Payer: Medicare Other | Admitting: Adult Health

## 2014-07-15 ENCOUNTER — Ambulatory Visit (INDEPENDENT_AMBULATORY_CARE_PROVIDER_SITE_OTHER): Payer: Medicare Other | Admitting: Cardiology

## 2014-07-15 ENCOUNTER — Encounter: Payer: Self-pay | Admitting: Cardiology

## 2014-07-15 VITALS — BP 136/76 | HR 53 | Ht 73.0 in | Wt 212.8 lb

## 2014-07-15 DIAGNOSIS — Z01810 Encounter for preprocedural cardiovascular examination: Secondary | ICD-10-CM | POA: Diagnosis not present

## 2014-07-15 DIAGNOSIS — I2510 Atherosclerotic heart disease of native coronary artery without angina pectoris: Secondary | ICD-10-CM | POA: Diagnosis not present

## 2014-07-15 DIAGNOSIS — I779 Disorder of arteries and arterioles, unspecified: Secondary | ICD-10-CM | POA: Diagnosis not present

## 2014-07-15 DIAGNOSIS — I10 Essential (primary) hypertension: Secondary | ICD-10-CM | POA: Diagnosis not present

## 2014-07-15 DIAGNOSIS — I739 Peripheral vascular disease, unspecified: Secondary | ICD-10-CM

## 2014-07-15 NOTE — Progress Notes (Signed)
Cardiology Office Note   Date:  07/15/2014   ID:  Jerry [name], DOB 03-14-1930, MRN [id number]  PCP:  Jerry Lek, MD  Cardiologist:  Willa Rough, MD   Chief Complaint  Patient presents with  . Appointment    Preoperative cardiac clearance for orthopedic surgery      History of Present Illness: Jerry [name] is a 81 y.o. male who presents today to follow-up coronary artery disease and preoperative cardiac evaluation for hip surgery. He is doing well. He has discomfort in his hip and he needs a right hip operation. He is active enough to know that his exercise capacity is greater than 4 mets. He is not having any chest pain or significant shortness of breath. He had a nuclear study in 2012 showing no significant ischemia.    Past Medical History  Diagnosis Date  . Dyslipidemia   . Medication intolerance     NIACIN  . CAD (coronary artery disease)     no ischemia nuclear scan 12/2005 EF60% /  nuclear, October, 2012, no ischemia, EF normal  . Atrial flutter     limited post CABG  . Hiatal hernia   . Peptic ulcer disease   . Hypertension   . Hx of CABG     2002  . Ejection fraction     EF. 60%, Nuclear, 2007 /   EF, 60%, nuclear, October, 2012  . Carotid artery disease     Doppler, October, 2011, 40-59% bilateral  . Weight loss     August, 2012  . Myocardial infarction     1996  . Arthritis     knees and shoulders   . Hearing aid worn     bilateral ears     Past Surgical History  Procedure Laterality Date  . Coronary artery bypass graft  2002  . Cardiac catheterization    . Joint replacement      left knee replacement 4/11  . Total knee arthroplasty  05/13/2011    Procedure: TOTAL KNEE ARTHROPLASTY;  Surgeon: Loanne Drilling, MD;  Location: [facility];  Service: Orthopedics;  Laterality: Right;    Patient Active Problem List   Diagnosis Date Noted  . Ejection fraction     Priority: High  . Idiopathic neuropathy 05/27/2013  . Tingling in extremities  05/27/2013  . Postop Transfusion history 05/16/2011  . Postop Hyponatremia 05/16/2011  . Postop Acute blood loss anemia 05/14/2011  . OA (osteoarthritis) of knee 05/13/2011  . CAD (coronary artery disease)   . Weight loss   . Dyslipidemia   . Medication intolerance   . Hx of CABG   . Atrial flutter   . Peptic ulcer disease   . Hypertension   . Carotid artery disease       Current Outpatient Prescriptions  Medication Sig Dispense Refill  . amLODipine (NORVASC) 10 MG tablet TAKE ONE TABLET BY MOUTH ONCE DAILY. 90 tablet 2  . aspirin 81 MG tablet Take 81 mg by mouth daily.    Marland Kitchen gabapentin (NEURONTIN) 600 MG tablet Take 2 tablets (1,200 mg total) by mouth 3 (three) times daily. 540 tablet 3  . metoprolol (LOPRESSOR) 50 MG tablet TAKE ONE-HALF TABLET BY MOUTH THREE TIMES DAILY 135 tablet 1  . Pyridoxine HCl (VITAMIN B6 PO) Take by mouth daily.    . ranitidine (ZANTAC) 300 MG capsule Take 300 mg by mouth daily.     . simvastatin (ZOCOR) 20 MG tablet TAKE ONE TABLET BY MOUTH AT BEDTIME  90 tablet 1  . Sulfapyridine POWD by Does not apply route as directed.     No current facility-administered medications for this visit.    Allergies:   Review of patient's allergies indicates no known allergies.    Social History:  The patient  reports that he has quit smoking. He has never used smokeless tobacco. He reports that he does not drink alcohol or use illicit drugs.   Family History:  The patient's family history includes Stroke in his father and mother.    ROS:  Please see the history of present illness.     Patient denies fever, chills, headache, sweats, rash, change in vision, change in hearing, chest pain, cough, nausea or vomiting, urinary symptoms. All other systems are reviewed and are negative.  PHYSICAL EXAM: VS:  BP 136/76 mmHg  Pulse 53  Ht 6\' 1"  (1.854 m)  Wt 212 lb 12.8 oz (96.525 kg)  BMI 28.08 kg/m2 , Patient is stable. He is oriented to person time and place. Affect is  normal. Head is atraumatic. Sclerae and conjunctivae are normal. There is no jugular venous distention. Lungs are clear. Respiratory effort is nonlabored. Cardiac exam reveals S1 and S2. Abdomen is soft. There is no peripheral edema. There are no musculoskeletal deformities. There are no skin rashes.  EKG:   EKG is done today and reviewed by me. 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 There is one PVC noted. There is no significant change from the past.   Recent Labs: No results found for requested labs within last 365 days.    Lipid Panel    Component Value Date/Time   CHOL 134 01/07/2011 0918   TRIG 86.0 01/07/2011 0918   HDL 43.50 01/07/2011 0918   CHOLHDL 3 01/07/2011 0918   VLDL 17.2 01/07/2011 0918   LDLCALC 73 01/07/2011 0918      Wt Readings from Last 3 Encounters:  07/15/14 212 lb 12.8 oz (96.525 kg)  07/01/14 214 lb (97.07 kg)  10/26/13 216 lb (97.977 kg)      Current medicines are reviewed  The patient understands his medications.     ASSESSMENT AND PLAN:

## 2014-07-15 NOTE — Assessment & Plan Note (Signed)
Blood pressures controlled. No change in therapy. 

## 2014-07-15 NOTE — Assessment & Plan Note (Signed)
The patient is stable. I have reviewed all parameters and he is cleared for hip surgery. His exercise level is good and he is not having any significant cardiac symptoms. The patient is cleared to have right hip surgery.

## 2014-07-15 NOTE — Assessment & Plan Note (Signed)
Coronary disease is stable. He is not having any symptoms. Nuclear study in October, 2012, revealed no ischemia. No further workup

## 2014-07-15 NOTE — Patient Instructions (Signed)
**Note De-identified  Obfuscation** Your physician recommends that you continue on your current medications as directed. Please refer to the Current Medication list given to you today.  Your physician wants you to follow-up in: 1 year. You will receive a reminder letter in the mail two months in advance. If you don't receive a letter, please call our office to schedule the follow-up appointment.  

## 2014-07-15 NOTE — Assessment & Plan Note (Signed)
His carotid disease is stable. He had a Doppler in October, 2016. He is scheduled to have another one a year from that date.

## 2014-08-01 ENCOUNTER — Other Ambulatory Visit: Payer: Self-pay | Admitting: Cardiology

## 2014-08-05 ENCOUNTER — Ambulatory Visit: Payer: Medicare Other | Admitting: Cardiology

## 2014-08-31 NOTE — Progress Notes (Signed)
Please put orders in Epic surgery 09-04-14 pre op 09-06-14 Thanks

## 2014-09-01 ENCOUNTER — Ambulatory Visit: Payer: Self-pay | Admitting: Orthopedic Surgery

## 2014-09-01 NOTE — Progress Notes (Signed)
Preoperative surgical orders have been place into the Epic hospital system for Jerry [name] on 09/01/2014, 9:47 PM  by Patrica Duel for surgery on 09/14/14.  Preop Total Hip - Anterior Approach orders including IV Tylenol, and IV Decadron as long as there are no contraindications to the above medications. Jerry Peace, PA-C

## 2014-09-06 ENCOUNTER — Encounter (HOSPITAL_COMMUNITY): Payer: Self-pay

## 2014-09-06 ENCOUNTER — Encounter (HOSPITAL_COMMUNITY)
Admission: RE | Admit: 2014-09-06 | Discharge: 2014-09-06 | Disposition: A | Discharge status: Home / Self Care | Payer: Medicare Other | Source: Ambulatory Visit | Attending: Orthopedic Surgery | Admitting: Orthopedic Surgery

## 2014-09-06 DIAGNOSIS — Z01812 Encounter for preprocedural laboratory examination: Secondary | ICD-10-CM | POA: Diagnosis present

## 2014-09-06 DIAGNOSIS — M1611 Unilateral primary osteoarthritis, right hip: Secondary | ICD-10-CM | POA: Diagnosis not present

## 2014-09-06 HISTORY — DX: Pneumonia, unspecified organism: J18.9

## 2014-09-06 HISTORY — DX: Gastro-esophageal reflux disease without esophagitis: K21.9

## 2014-09-06 LAB — COMPREHENSIVE METABOLIC PANEL
ALT: 15 U/L — ABNORMAL LOW (ref 17–63)
ANION GAP: 4 — AB (ref 5–15)
AST: 23 U/L (ref 15–41)
Albumin: 3.8 g/dL (ref 3.5–5.0)
Alkaline Phosphatase: 75 U/L (ref 38–126)
BILIRUBIN TOTAL: 0.5 mg/dL (ref 0.3–1.2)
BUN: 15 mg/dL (ref 6–20)
CO2: 29 mmol/L (ref 22–32)
Calcium: 9.3 mg/dL (ref 8.9–10.3)
Chloride: 106 mmol/L (ref 101–111)
Creatinine, Ser: 0.92 mg/dL (ref 0.61–1.24)
GFR calc non Af Amer: >60 mL/min (ref >60)
Glucose, Bld: 101 mg/dL — ABNORMAL HIGH (ref 70–99)
POTASSIUM: 4.3 mmol/L (ref 3.5–5.1)
SODIUM: 139 mmol/L (ref 135–145)
Total Protein: 7.7 g/dL (ref 6.5–8.1)

## 2014-09-06 LAB — URINALYSIS, ROUTINE W REFLEX MICROSCOPIC
Bilirubin Urine: NEGATIVE
Glucose, UA: NEGATIVE mg/dL
HGB URINE DIPSTICK: NEGATIVE
Ketones, ur: NEGATIVE mg/dL
Leukocytes, UA: NEGATIVE
Nitrite: NEGATIVE
Protein, ur: NEGATIVE mg/dL
SPECIFIC GRAVITY, URINE: 1.019 (ref 1.005–1.030)
UROBILINOGEN UA: 1 mg/dL (ref 0.0–1.0)
pH: 6.5 (ref 5.0–8.0)

## 2014-09-06 LAB — CBC
HCT: 39.9 % (ref 39.0–52.0)
HEMOGLOBIN: 13.3 g/dL (ref 13.0–17.0)
MCH: 29.4 pg (ref 26.0–34.0)
MCHC: 33.3 g/dL (ref 30.0–36.0)
MCV: 88.3 fL (ref 78.0–100.0)
Platelets: 238 10*3/uL (ref 150–400)
RBC: 4.52 MIL/uL (ref 4.22–5.81)
RDW: 13.9 % (ref 11.5–15.5)
WBC: 7.4 10*3/uL (ref 4.0–10.5)

## 2014-09-06 LAB — PROTIME-INR
INR: 0.96 (ref 0.00–1.49)
Prothrombin Time: 12.9 seconds (ref 11.6–15.2)

## 2014-09-06 LAB — SURGICAL PCR SCREEN
MRSA, PCR: NEGATIVE
STAPHYLOCOCCUS AUREUS: NEGATIVE

## 2014-09-06 LAB — APTT: aPTT: 32 seconds (ref 24–37)

## 2014-09-06 NOTE — Progress Notes (Signed)
Medical clearance note Dr. Rosezetta Schlatter 02/10/14 on chart, LOV note with cardiac clearance 07/15/14 Dr. Myrtis Ser on [facility], EKG 07/15/14 on EPIC

## 2014-09-06 NOTE — Patient Instructions (Signed)
MORRISON MCBRYAR  09/06/2014   Your procedure is scheduled on: Wednesday 09/14/14   Report to [facility] Main  Entrance and follow signs to               Short Stay Center at 08:45 AM.  Call this number if you have problems the morning of surgery [phone]   Remember: ONLY 1 PERSON MAY GO WITH YOU TO SHORT STAY TO GET  READY MORNING OF YOUR SURGERY.  Do not eat food or drink liquids :After Midnight.     Take these medicines the morning of surgery with A SIP OF WATER: amlodipine, gabapentin, metoprolol, ranitidine                                You may not have any metal on your body including hair pins and              piercings  Do not wear jewelry, make-up, lotions, powders or perfumes.             Do not wear nail polish.  Do not shave  48 hours prior to surgery.              Men may shave face and neck.  Do not bring valuables to the hospital.  IS NOT             RESPONSIBLE   FOR VALUABLES.  Contacts, dentures or bridgework may not be worn into surgery.  Leave suitcase in the car. After surgery it may be brought to your room.               Please read over the following fact sheets you were given: MRSA information  _____________________________________________________________________           [facility] - Preparing for Surgery Before surgery, you can play an important role.  Because skin is not sterile, your skin needs to be as free of germs as possible.  You can reduce the number of germs on your skin by washing with CHG (chlorahexidine gluconate) soap before surgery.  CHG is an antiseptic cleaner which kills germs and bonds with the skin to continue killing germs even after washing. Please DO NOT use if you have an allergy to CHG or antibacterial soaps.  If your skin becomes reddened/irritated stop using the CHG and inform your nurse when you arrive at Short Stay. Do not shave (including legs and underarms) for at least 48 hours prior to the  first CHG shower.  You may shave your face/neck. Please follow these instructions carefully:  1.  Shower with CHG Soap the night before surgery and the  morning of Surgery.  2.  If you choose to wash your hair, wash your hair first as usual with your  normal  shampoo.  3.  After you shampoo, rinse your hair and body thoroughly to remove the  shampoo.                            4.  Use CHG as you would any other liquid soap.  You can apply chg directly  to the skin and wash                       Gently with a scrungie or  clean washcloth.  5.  Apply the CHG Soap to your body ONLY FROM THE NECK DOWN.   Do not use on face/ open                           Wound or open sores. Avoid contact with eyes, ears mouth and genitals (private parts).                       Wash face,  Genitals (private parts) with your normal soap.             6.  Wash thoroughly, paying special attention to the area where your surgery  will be performed.  7.  Thoroughly rinse your body with warm water from the neck down.  8.  DO NOT shower/wash with your normal soap after using and rinsing off  the CHG Soap.                9.  Pat yourself dry with a clean towel.            10.  Wear clean pajamas.            11.  Place clean sheets on your bed the night of your first shower and do not  sleep with pets. Day of Surgery : Do not apply any lotions/deodorants the morning of surgery.  Please wear clean clothes to the hospital/surgery center.  FAILURE TO FOLLOW THESE INSTRUCTIONS MAY RESULT IN THE CANCELLATION OF YOUR SURGERY PATIENT SIGNATURE_________________________________  NURSE SIGNATURE__________________________________  ________________________________________________________________________  WHAT IS A BLOOD TRANSFUSION? Blood Transfusion Information  A transfusion is the replacement of blood or some of its parts. Blood is made up of multiple cells which provide different functions.  Red blood cells carry oxygen and  are used for blood loss replacement.  White blood cells fight against infection.  Platelets control bleeding.  Plasma helps clot blood.  Other blood products are available for specialized needs, such as hemophilia or other clotting disorders. BEFORE THE TRANSFUSION  Who gives blood for transfusions?   Healthy volunteers who are fully evaluated to make sure their blood is safe. This is blood bank blood. Transfusion therapy is the safest it has ever been in the practice of medicine. Before blood is taken from a donor, a complete history is taken to make sure that person has no history of diseases nor engages in risky social behavior (examples are intravenous drug use or sexual activity with multiple partners). The donor's travel history is screened to minimize risk of transmitting infections, such as malaria. The donated blood is tested for signs of infectious diseases, such as HIV and hepatitis. The blood is then tested to be sure it is compatible with you in order to minimize the chance of a transfusion reaction. If you or a relative donates blood, this is often done in anticipation of surgery and is not appropriate for emergency situations. It takes many days to process the donated blood. RISKS AND COMPLICATIONS Although transfusion therapy is very safe and saves many lives, the main dangers of transfusion include:  1. Getting an infectious disease. 2. Developing a transfusion reaction. This is an allergic reaction to something in the blood you were given. Every precaution is taken to prevent this. The decision to have a blood transfusion has been considered carefully by your caregiver before blood is given. Blood is not given unless the benefits outweigh the risks. AFTER THE TRANSFUSION  Right after receiving a blood transfusion, you will usually feel much better and more energetic. This is especially true if your red blood cells have gotten low (anemic). The transfusion raises the level of  the red blood cells which carry oxygen, and this usually causes an energy increase.  The nurse administering the transfusion will monitor you carefully for complications. HOME CARE INSTRUCTIONS  No special instructions are needed after a transfusion. You may find your energy is better. Speak with your caregiver about any limitations on activity for underlying diseases you may have. SEEK MEDICAL CARE IF:   Your condition is not improving after your transfusion.  You develop redness or irritation at the intravenous (IV) site. SEEK IMMEDIATE MEDICAL CARE IF:  Any of the following symptoms occur over the next 12 hours:  Shaking chills.  You have a temperature by mouth above 102 F (38.9 C), not controlled by medicine.  Chest, back, or muscle pain.  People around you feel you are not acting correctly or are confused.  Shortness of breath or difficulty breathing.  Dizziness and fainting.  You get a rash or develop hives.  You have a decrease in urine output.  Your urine turns a dark color or changes to pink, red, or brown. Any of the following symptoms occur over the next 10 days:  You have a temperature by mouth above 102 F (38.9 C), not controlled by medicine.  Shortness of breath.  Weakness after normal activity.  The white part of the eye turns yellow (jaundice).  You have a decrease in the amount of urine or are urinating less often.  Your urine turns a dark color or changes to pink, red, or brown. Document Released: 04/12/2000 Document Revised: 07/08/2011 Document Reviewed: 11/30/2007 ExitCare Patient Information 2014 [place], [place].  _______________________________________________________________________  Incentive Spirometer  An incentive spirometer is a tool that can help keep your lungs clear and active. This tool measures how well you are filling your lungs with each breath. Taking long deep breaths may help reverse or decrease the chance of developing  breathing (pulmonary) problems (especially infection) following:  A long period of time when you are unable to move or be active. BEFORE THE PROCEDURE   If the spirometer includes an indicator to show your best effort, your nurse or respiratory therapist will set it to a desired goal.  If possible, sit up straight or lean slightly forward. Try not to slouch.  Hold the incentive spirometer in an upright position. INSTRUCTIONS FOR USE  3. Sit on the edge of your bed if possible, or sit up as far as you can in bed or on a chair. 4. Hold the incentive spirometer in an upright position. 5. Breathe out normally. 6. Place the mouthpiece in your mouth and seal your lips tightly around it. 7. Breathe in slowly and as deeply as possible, raising the piston or the ball toward the top of the column. 8. Hold your breath for 3-5 seconds or for as long as possible. Allow the piston or ball to fall to the bottom of the column. 9. Remove the mouthpiece from your mouth and breathe out normally. 10. Rest for a few seconds and repeat Steps 1 through 7 at least 10 times every 1-2 hours when you are awake. Take your time and take a few normal breaths between deep breaths. 11. The spirometer may include an indicator to show your best effort. Use the indicator as a goal to work toward during each repetition. 12. After  each set of 10 deep breaths, practice coughing to be sure your lungs are clear. If you have an incision (the cut made at the time of surgery), support your incision when coughing by placing a pillow or rolled up towels firmly against it. Once you are able to get out of bed, walk around indoors and cough well. You may stop using the incentive spirometer when instructed by your caregiver.  RISKS AND COMPLICATIONS  Take your time so you do not get dizzy or light-headed.  If you are in pain, you may need to take or ask for pain medication before doing incentive spirometry. It is harder to take a deep  breath if you are having pain. AFTER USE  Rest and breathe slowly and easily.  It can be helpful to keep track of a log of your progress. Your caregiver can provide you with a simple table to help with this. If you are using the spirometer at home, follow these instructions: [place] IF:   You are having difficultly using the spirometer.  You have trouble using the spirometer as often as instructed.  Your pain medication is not giving enough relief while using the spirometer.  You develop fever of 100.5 F (38.1 C) or higher. SEEK IMMEDIATE MEDICAL CARE IF:   You cough up bloody sputum that had not been present before.  You develop fever of 102 F (38.9 C) or greater.  You develop worsening pain at or near the incision site. MAKE SURE YOU:   Understand these instructions.  Will watch your condition.  Will get help right away if you are not doing well or get worse. Document Released: 08/26/2006 Document Revised: 07/08/2011 Document Reviewed: 10/27/2006 [facility] Patient Information 2014 [place], [place].   ________________________________________________________________________

## 2014-09-06 NOTE — Progress Notes (Signed)
   09/06/14 0958  OBSTRUCTIVE SLEEP APNEA  Have you ever been diagnosed with sleep apnea through a sleep study? No  Do you snore loudly (loud enough to be heard through closed doors)?  1  Do you often feel tired, fatigued, or sleepy during the daytime? 0  Has anyone observed you stop breathing during your sleep? 0  Do you have, or are you being treated for high blood pressure? 1  BMI more than 35 kg/m2? 0  Age over 79 years old? 1  Neck circumference greater than 40 cm/16 inches? 0  Gender: 1  Obstructive Sleep Apnea Score 4

## 2014-09-09 NOTE — H&P (Addendum)
TOTAL HIP ADMISSION H&P  Patient is admitted for right total hip arthroplasty.  Subjective:  Chief Complaint: right hip pain  HPI: Jerry [name], 81 y.o. male, has a history of pain and functional disability in the right hip(s) due to arthritis and patient has failed non-surgical conservative treatments for greater than 12 weeks to include NSAID's and/or analgesics, corticosteriod injections and activity modification.  Onset of symptoms was gradual starting 3 years ago with gradually worsening course since that time.The patient noted no past surgery on the right hip(s).  Patient currently rates pain in the right hip at 7 out of 10 with activity. Patient has night pain, worsening of pain with activity and weight bearing, pain that interfers with activities of daily living, pain with passive range of motion and crepitus. Patient has evidence of subchondral cysts, periarticular osteophytes and joint space narrowing by imaging studies. This condition presents safety issues increasing the risk of falls.   There is no current active infection.  Patient Active Problem List   Diagnosis Date Noted  . Pre-operative cardiovascular examination 07/15/2014  . Idiopathic neuropathy 05/27/2013  . Tingling in extremities 05/27/2013  . Postop Transfusion history 05/16/2011  . Postop Hyponatremia 05/16/2011  . Postop Acute blood loss anemia 05/14/2011  . OA (osteoarthritis) of knee 05/13/2011  . CAD (coronary artery disease)   . Ejection fraction   . Weight loss   . Dyslipidemia   . Medication intolerance   . Hx of CABG   . Atrial flutter   . Peptic ulcer disease   . Hypertension   . Carotid artery disease    Past Medical History  Diagnosis Date  . Dyslipidemia     under control  . Medication intolerance     NIACIN  . CAD (coronary artery disease)     no ischemia nuclear scan 12/2005 EF60% /  nuclear, October, 2012, no ischemia, EF normal  . Hiatal hernia   . Peptic ulcer disease     "just one  little ulcer"  . Hypertension   . Hx of CABG     2002  . Ejection fraction     EF. 60%, Nuclear, 2007 /   EF, 60%, nuclear, October, 2012  . Carotid artery disease     Doppler, October, 2011, 40-59% bilateral  . Weight loss     August, 2012  . Myocardial infarction     1996  . Arthritis     knees and shoulders   . Hearing aid worn     bilateral ears   . Pneumonia     hx of x1  . GERD (gastroesophageal reflux disease)     Past Surgical History  Procedure Laterality Date  . Cardiac catheterization    . Total knee arthroplasty  05/13/2011    Procedure: TOTAL KNEE ARTHROPLASTY;  Surgeon: Loanne Drilling, MD;  Location: [facility];  Service: Orthopedics;  Laterality: Right;  . Coronary artery bypass graft      5  . Joint replacement Bilateral 2011, 2013    knees  . Hand surgery Right ~1998     Current outpatient prescriptions:  .  amLODipine (NORVASC) 10 MG tablet, TAKE ONE TABLET BY MOUTH ONCE DAILY., Disp: 90 tablet, Rfl: 2 .  aspirin 81 MG tablet, Take 81 mg by mouth daily., Disp: , Rfl:  .  gabapentin (NEURONTIN) 600 MG tablet, Take 2 tablets (1,200 mg total) by mouth 3 (three) times daily., Disp: 540 tablet, Rfl: 3 .  metoprolol (LOPRESSOR) 50 MG  tablet, TAKE ONE-HALF TABLET BY MOUTH THREE TIMES DAILY, Disp: 135 tablet, Rfl: 3 .  Multiple Vitamin (MULTIVITAMIN WITH MINERALS) TABS tablet, Take 1 tablet by mouth daily., Disp: , Rfl:  .  ranitidine (ZANTAC) 300 MG capsule, Take 300 mg by mouth daily. , Disp: , Rfl:  .  simvastatin (ZOCOR) 20 MG tablet, TAKE ONE TABLET BY MOUTH AT BEDTIME, Disp: 90 tablet, Rfl: 1  No Known Allergies  History  Substance Use Topics  . Smoking status: Former Smoker    Types: Cigarettes, Cigars    Quit date: 04/29/1994  . Smokeless tobacco: Never Used     Comment: Quit 35 years ago   . Alcohol Use: No    Family History  Problem Relation Age of Onset  . Stroke Mother   . Stroke Father      Review of Systems  Constitutional: Negative.    HENT: Positive for tinnitus. Negative for congestion, ear discharge, ear pain, hearing loss, nosebleeds and sore throat.   Eyes: Negative.   Respiratory: Negative.  Negative for stridor.   Cardiovascular: Negative.   Gastrointestinal: Positive for constipation. Negative for heartburn, nausea, vomiting, abdominal pain, diarrhea, blood in stool and melena.  Genitourinary: Positive for frequency. Negative for dysuria, urgency, hematuria and flank pain.  Musculoskeletal: Positive for joint pain. Negative for myalgias, back pain, falls and neck pain.       Right hip pain  Skin: Negative.   Neurological: Negative.  Negative for headaches.  Endo/Heme/Allergies: Negative.   Psychiatric/Behavioral: Negative.     Objective:  Physical Exam  Constitutional: He is oriented to person, place, and time. He appears well-developed and well-nourished. No distress.  HENT:  Head: Normocephalic and atraumatic.  Right Ear: External ear normal.  Left Ear: External ear normal.  Nose: Nose normal.  Mouth/Throat: Oropharynx is clear and moist.  Eyes: Conjunctivae and EOM are normal.  Neck: Normal range of motion. Neck supple.  Cardiovascular: Normal rate, regular rhythm, normal heart sounds and intact distal pulses.   No murmur heard. Respiratory: Effort normal and breath sounds normal. No respiratory distress. He has no wheezes.  GI: Soft. Bowel sounds are normal. He exhibits no distension. There is no tenderness.  Musculoskeletal:       Right hip: He exhibits decreased range of motion and decreased strength.       Left hip: Normal.       Right knee: Normal.       Left knee: Normal.  His left hip has normal range of motion with no discomfort. His right hip can be flexed to 100, no internal rotation, about 10 external rotation, 10 abduction. Both knees show no swelling, range about 0 to 120 on each side with no tenderness or instability.  Neurological: He is alert and oriented to person, place, and time.  He has normal strength and normal reflexes. No sensory deficit.  Skin: No rash noted. He is not diaphoretic. No erythema.  Psychiatric: He has a normal mood and affect. His behavior is normal.    Vitals  Weight: 212 lb Height: 73in Body Surface Area: 2.21 m Body Mass Index: 27.97 kg/m  Pulse: 64 (Regular)  BP: 112/64 (Sitting, Left Arm, Standard)  Imaging Review Plain radiographs demonstrate severe degenerative joint disease of the right hip(s). The bone quality appears to be good for age and reported activity level.  Assessment/Plan:  End stage primary osteoarthritis, right hip(s)  The patient history, physical examination, clinical judgement of the provider and imaging studies are consistent  with end stage degenerative joint disease of the right hip(s) and total hip arthroplasty is deemed medically necessary. The treatment options including medical management, injection therapy, arthroscopy and arthroplasty were discussed at length. The risks and benefits of total hip arthroplasty were presented and reviewed. The risks due to aseptic loosening, infection, stiffness, dislocation/subluxation,  thromboembolic complications and other imponderables were discussed.  The patient acknowledged the explanation, agreed to proceed with the plan and consent was signed. Patient is being admitted for inpatient treatment for surgery, pain control, PT, OT, prophylactic antibiotics, VTE prophylaxis, progressive ambulation and ADL's and discharge planning.The patient is planning to be discharged to SNF [facility])    PCP: Dr. Willeen Cass Cardio: Dr. Myrtis Ser  Topical TXA  Dimitri Ped, PA-C

## 2014-09-14 ENCOUNTER — Inpatient Hospital Stay (HOSPITAL_COMMUNITY)
Admission: RE | Admit: 2014-09-14 | Discharge: 2014-09-16 | DRG: 470 | Disposition: A | Discharge status: Skilled Nursing Facility | Payer: Medicare Other | Source: Ambulatory Visit | Attending: Orthopedic Surgery | Admitting: Orthopedic Surgery

## 2014-09-14 ENCOUNTER — Encounter (HOSPITAL_COMMUNITY)
Admission: RE | Disposition: A | Discharge status: Skilled Nursing Facility | Payer: Self-pay | Source: Ambulatory Visit | Attending: Orthopedic Surgery

## 2014-09-14 ENCOUNTER — Inpatient Hospital Stay (HOSPITAL_COMMUNITY): Payer: Medicare Other

## 2014-09-14 ENCOUNTER — Encounter (HOSPITAL_COMMUNITY): Payer: Self-pay | Admitting: *Deleted

## 2014-09-14 ENCOUNTER — Inpatient Hospital Stay (HOSPITAL_COMMUNITY): Payer: Medicare Other | Admitting: Registered Nurse

## 2014-09-14 DIAGNOSIS — M169 Osteoarthritis of hip, unspecified: Secondary | ICD-10-CM | POA: Diagnosis present

## 2014-09-14 DIAGNOSIS — M1611 Unilateral primary osteoarthritis, right hip: Secondary | ICD-10-CM | POA: Diagnosis present

## 2014-09-14 DIAGNOSIS — I10 Essential (primary) hypertension: Secondary | ICD-10-CM | POA: Diagnosis present

## 2014-09-14 DIAGNOSIS — Z951 Presence of aortocoronary bypass graft: Secondary | ICD-10-CM

## 2014-09-14 DIAGNOSIS — E785 Hyperlipidemia, unspecified: Secondary | ICD-10-CM | POA: Diagnosis present

## 2014-09-14 DIAGNOSIS — K219 Gastro-esophageal reflux disease without esophagitis: Secondary | ICD-10-CM | POA: Diagnosis present

## 2014-09-14 DIAGNOSIS — Z8711 Personal history of peptic ulcer disease: Secondary | ICD-10-CM | POA: Diagnosis not present

## 2014-09-14 DIAGNOSIS — I252 Old myocardial infarction: Secondary | ICD-10-CM | POA: Diagnosis not present

## 2014-09-14 DIAGNOSIS — I2510 Atherosclerotic heart disease of native coronary artery without angina pectoris: Secondary | ICD-10-CM | POA: Diagnosis present

## 2014-09-14 DIAGNOSIS — I4892 Unspecified atrial flutter: Secondary | ICD-10-CM | POA: Diagnosis present

## 2014-09-14 DIAGNOSIS — Z96649 Presence of unspecified artificial hip joint: Secondary | ICD-10-CM

## 2014-09-14 DIAGNOSIS — Z87891 Personal history of nicotine dependence: Secondary | ICD-10-CM

## 2014-09-14 HISTORY — PX: TOTAL HIP ARTHROPLASTY: SHX124

## 2014-09-14 LAB — TYPE AND SCREEN
ABO/RH(D): O POS
Antibody Screen: NEGATIVE

## 2014-09-14 SURGERY — ARTHROPLASTY, HIP, TOTAL, ANTERIOR APPROACH
Anesthesia: Spinal | Site: Hip | Laterality: Right

## 2014-09-14 MED ORDER — FAMOTIDINE 40 MG PO TABS
40.0000 mg | ORAL_TABLET | Freq: Every day | ORAL | Status: DC
  Administered 2014-09-15 – 2014-09-16 (×2): 40 mg via ORAL
  Filled 2014-09-14 (×2): qty 1

## 2014-09-14 MED ORDER — RIVAROXABAN 10 MG PO TABS
10.0000 mg | ORAL_TABLET | Freq: Every day | ORAL | Status: DC
  Administered 2014-09-15 – 2014-09-16 (×2): 10 mg via ORAL
  Filled 2014-09-14 (×3): qty 1

## 2014-09-14 MED ORDER — SIMVASTATIN 20 MG PO TABS
20.0000 mg | ORAL_TABLET | Freq: Every day | ORAL | Status: DC
  Administered 2014-09-14 – 2014-09-15 (×2): 20 mg via ORAL
  Filled 2014-09-14 (×3): qty 1

## 2014-09-14 MED ORDER — 0.9 % SODIUM CHLORIDE (POUR BTL) OPTIME
TOPICAL | Status: DC | PRN
  Administered 2014-09-14: 1000 mL

## 2014-09-14 MED ORDER — TRAMADOL HCL 50 MG PO TABS: 50.0000 mg | ORAL_TABLET | Freq: Four times a day (QID) | ORAL | Status: DC | PRN

## 2014-09-14 MED ORDER — DIPHENHYDRAMINE HCL 12.5 MG/5ML PO ELIX: 12.5000 mg | ORAL_SOLUTION | ORAL | Status: DC | PRN

## 2014-09-14 MED ORDER — ONDANSETRON HCL 4 MG/2ML IJ SOLN: 4.0000 mg | Freq: Four times a day (QID) | INTRAMUSCULAR | Status: DC | PRN

## 2014-09-14 MED ORDER — DEXAMETHASONE SODIUM PHOSPHATE 10 MG/ML IJ SOLN
INTRAMUSCULAR | Status: AC
  Filled 2014-09-14: qty 1

## 2014-09-14 MED ORDER — CEFAZOLIN SODIUM-DEXTROSE 2-3 GM-% IV SOLR
2.0000 g | INTRAVENOUS | Status: AC
  Administered 2014-09-14: 2 g via INTRAVENOUS

## 2014-09-14 MED ORDER — PROPOFOL 10 MG/ML IV BOLUS
INTRAVENOUS | Status: AC
  Filled 2014-09-14: qty 20

## 2014-09-14 MED ORDER — PHENYLEPHRINE HCL 10 MG/ML IJ SOLN
INTRAMUSCULAR | Status: AC
  Filled 2014-09-14: qty 1

## 2014-09-14 MED ORDER — METHOCARBAMOL 1000 MG/10ML IJ SOLN
500.0000 mg | Freq: Four times a day (QID) | INTRAMUSCULAR | Status: DC | PRN
  Filled 2014-09-14: qty 5

## 2014-09-14 MED ORDER — ACETAMINOPHEN 10 MG/ML IV SOLN
1000.0000 mg | Freq: Once | INTRAVENOUS | Status: AC
Start: 2014-09-14 — End: 2014-09-14
  Administered 2014-09-14: 1000 mg via INTRAVENOUS
  Filled 2014-09-14: qty 100

## 2014-09-14 MED ORDER — FENTANYL CITRATE (PF) 100 MCG/2ML IJ SOLN: 25.0000 ug | INTRAMUSCULAR | Status: DC | PRN

## 2014-09-14 MED ORDER — BISACODYL 10 MG RE SUPP: 10.0000 mg | Freq: Every day | RECTAL | Status: DC | PRN

## 2014-09-14 MED ORDER — DOCUSATE SODIUM 100 MG PO CAPS
100.0000 mg | ORAL_CAPSULE | Freq: Two times a day (BID) | ORAL | Status: DC
  Administered 2014-09-14 – 2014-09-16 (×4): 100 mg via ORAL

## 2014-09-14 MED ORDER — FLEET ENEMA 7-19 GM/118ML RE ENEM: 1.0000 | ENEMA | Freq: Once | RECTAL | Status: AC | PRN

## 2014-09-14 MED ORDER — STERILE WATER FOR IRRIGATION IR SOLN
Status: DC | PRN
  Administered 2014-09-14: 1500 mL

## 2014-09-14 MED ORDER — TRANEXAMIC ACID 1000 MG/10ML IV SOLN
2000.0000 mg | Freq: Once | INTRAVENOUS | Status: DC
  Filled 2014-09-14: qty 20

## 2014-09-14 MED ORDER — PROMETHAZINE HCL 25 MG/ML IJ SOLN: 6.2500 mg | INTRAMUSCULAR | Status: DC | PRN

## 2014-09-14 MED ORDER — BUPIVACAINE HCL (PF) 0.25 % IJ SOLN
INTRAMUSCULAR | Status: AC
  Filled 2014-09-14: qty 30

## 2014-09-14 MED ORDER — PHENYLEPHRINE HCL 10 MG/ML IJ SOLN
INTRAMUSCULAR | Status: DC | PRN
  Administered 2014-09-14: 120 ug via INTRAVENOUS

## 2014-09-14 MED ORDER — BUPIVACAINE HCL (PF) 0.25 % IJ SOLN
INTRAMUSCULAR | Status: DC | PRN
Start: 2014-09-14 — End: 2014-09-14
  Administered 2014-09-14: 30 mL

## 2014-09-14 MED ORDER — PHENOL 1.4 % MT LIQD: 1.0000 | OROMUCOSAL | Status: DC | PRN

## 2014-09-14 MED ORDER — MIDAZOLAM HCL 2 MG/2ML IJ SOLN
INTRAMUSCULAR | Status: AC
  Filled 2014-09-14: qty 2

## 2014-09-14 MED ORDER — METOPROLOL TARTRATE 25 MG PO TABS
25.0000 mg | ORAL_TABLET | Freq: Three times a day (TID) | ORAL | Status: DC
  Administered 2014-09-15 – 2014-09-16 (×4): 25 mg via ORAL
  Filled 2014-09-14 (×9): qty 1

## 2014-09-14 MED ORDER — ACETAMINOPHEN 500 MG PO TABS
1000.0000 mg | ORAL_TABLET | Freq: Four times a day (QID) | ORAL | Status: AC
  Administered 2014-09-14 – 2014-09-15 (×2): 1000 mg via ORAL
  Filled 2014-09-14 (×3): qty 2

## 2014-09-14 MED ORDER — PROPOFOL 10 MG/ML IV BOLUS
INTRAVENOUS | Status: DC | PRN
  Administered 2014-09-14: 20 mg via INTRAVENOUS

## 2014-09-14 MED ORDER — PROPOFOL INFUSION 10 MG/ML OPTIME
INTRAVENOUS | Status: DC | PRN
  Administered 2014-09-14: 50 ug/kg/min via INTRAVENOUS

## 2014-09-14 MED ORDER — METOCLOPRAMIDE HCL 10 MG PO TABS: 5.0000 mg | ORAL_TABLET | Freq: Three times a day (TID) | ORAL | Status: DC | PRN

## 2014-09-14 MED ORDER — ONDANSETRON HCL 4 MG PO TABS: 4.0000 mg | ORAL_TABLET | Freq: Four times a day (QID) | ORAL | Status: DC | PRN

## 2014-09-14 MED ORDER — GABAPENTIN 600 MG PO TABS
1200.0000 mg | ORAL_TABLET | Freq: Three times a day (TID) | ORAL | Status: DC
  Filled 2014-09-14: qty 2

## 2014-09-14 MED ORDER — ACETAMINOPHEN 325 MG PO TABS
650.0000 mg | ORAL_TABLET | Freq: Four times a day (QID) | ORAL | Status: DC | PRN
Start: 2014-09-15 — End: 2014-09-16

## 2014-09-14 MED ORDER — PHENYLEPHRINE HCL 10 MG/ML IJ SOLN
10.0000 mg | INTRAVENOUS | Status: DC | PRN
  Administered 2014-09-14: 25 ug/min via INTRAVENOUS

## 2014-09-14 MED ORDER — POLYETHYLENE GLYCOL 3350 17 G PO PACK: 17.0000 g | PACK | Freq: Every day | ORAL | Status: DC | PRN

## 2014-09-14 MED ORDER — FENTANYL CITRATE (PF) 100 MCG/2ML IJ SOLN
INTRAMUSCULAR | Status: AC
  Filled 2014-09-14: qty 2

## 2014-09-14 MED ORDER — MORPHINE SULFATE 2 MG/ML IJ SOLN: 1.0000 mg | INTRAMUSCULAR | Status: DC | PRN

## 2014-09-14 MED ORDER — DEXAMETHASONE SODIUM PHOSPHATE 10 MG/ML IJ SOLN
10.0000 mg | Freq: Once | INTRAMUSCULAR | Status: AC
  Administered 2014-09-14: 10 mg via INTRAVENOUS

## 2014-09-14 MED ORDER — METHOCARBAMOL 500 MG PO TABS
500.0000 mg | ORAL_TABLET | Freq: Four times a day (QID) | ORAL | Status: DC | PRN
  Administered 2014-09-14 – 2014-09-15 (×2): 500 mg via ORAL
  Filled 2014-09-14 (×2): qty 1

## 2014-09-14 MED ORDER — ACETAMINOPHEN 650 MG RE SUPP: 650.0000 mg | Freq: Four times a day (QID) | RECTAL | Status: DC | PRN

## 2014-09-14 MED ORDER — SODIUM CHLORIDE 0.9 % IV SOLN
INTRAVENOUS | Status: DC
  Administered 2014-09-14: 100 mL/h via INTRAVENOUS

## 2014-09-14 MED ORDER — FENTANYL CITRATE (PF) 100 MCG/2ML IJ SOLN
INTRAMUSCULAR | Status: DC | PRN
  Administered 2014-09-14: 100 ug via INTRAVENOUS

## 2014-09-14 MED ORDER — CEFAZOLIN SODIUM-DEXTROSE 2-3 GM-% IV SOLR
2.0000 g | Freq: Four times a day (QID) | INTRAVENOUS | Status: AC
  Administered 2014-09-14 (×2): 2 g via INTRAVENOUS
  Filled 2014-09-14 (×2): qty 50

## 2014-09-14 MED ORDER — GABAPENTIN 400 MG PO CAPS
1200.0000 mg | ORAL_CAPSULE | Freq: Three times a day (TID) | ORAL | Status: DC
  Administered 2014-09-14 – 2014-09-16 (×5): 1200 mg via ORAL
  Filled 2014-09-14 (×7): qty 3

## 2014-09-14 MED ORDER — MEPERIDINE HCL 50 MG/ML IJ SOLN: 6.2500 mg | INTRAMUSCULAR | Status: DC | PRN

## 2014-09-14 MED ORDER — PHENYLEPHRINE 40 MCG/ML (10ML) SYRINGE FOR IV PUSH (FOR BLOOD PRESSURE SUPPORT)
PREFILLED_SYRINGE | INTRAVENOUS | Status: AC
  Filled 2014-09-14: qty 10

## 2014-09-14 MED ORDER — METOCLOPRAMIDE HCL 5 MG/ML IJ SOLN: 5.0000 mg | Freq: Three times a day (TID) | INTRAMUSCULAR | Status: DC | PRN

## 2014-09-14 MED ORDER — DEXAMETHASONE SODIUM PHOSPHATE 10 MG/ML IJ SOLN
10.0000 mg | Freq: Once | INTRAMUSCULAR | Status: AC
  Administered 2014-09-15: 10 mg via INTRAVENOUS
  Filled 2014-09-14: qty 1

## 2014-09-14 MED ORDER — BUPIVACAINE IN DEXTROSE 0.75-8.25 % IT SOLN
INTRATHECAL | Status: DC | PRN
  Administered 2014-09-14: 1.8 mL via INTRATHECAL

## 2014-09-14 MED ORDER — CEFAZOLIN SODIUM-DEXTROSE 2-3 GM-% IV SOLR
INTRAVENOUS | Status: AC
  Filled 2014-09-14: qty 50

## 2014-09-14 MED ORDER — OXYCODONE HCL 5 MG PO TABS
5.0000 mg | ORAL_TABLET | ORAL | Status: DC | PRN
  Administered 2014-09-14: 5 mg via ORAL
  Administered 2014-09-14: 10 mg via ORAL
  Administered 2014-09-14: 5 mg via ORAL
  Administered 2014-09-15: 10 mg via ORAL
  Filled 2014-09-14: qty 2
  Filled 2014-09-14: qty 1
  Filled 2014-09-14: qty 2
  Filled 2014-09-14: qty 1
  Filled 2014-09-14: qty 2

## 2014-09-14 MED ORDER — TRANEXAMIC ACID 1000 MG/10ML IV SOLN
2000.0000 mg | INTRAVENOUS | Status: DC | PRN
  Administered 2014-09-14: 2000 mg via TOPICAL

## 2014-09-14 MED ORDER — MENTHOL 3 MG MT LOZG
1.0000 | LOZENGE | OROMUCOSAL | Status: DC | PRN
  Administered 2014-09-15: 3 mg via ORAL
  Filled 2014-09-14: qty 9

## 2014-09-14 MED ORDER — AMLODIPINE BESYLATE 10 MG PO TABS
10.0000 mg | ORAL_TABLET | Freq: Every day | ORAL | Status: DC
  Administered 2014-09-15 – 2014-09-16 (×2): 10 mg via ORAL
  Filled 2014-09-14 (×2): qty 1

## 2014-09-14 MED ORDER — GLYCOPYRROLATE 0.2 MG/ML IJ SOLN
INTRAMUSCULAR | Status: DC | PRN
  Administered 2014-09-14: 0.2 mg via INTRAVENOUS

## 2014-09-14 MED ORDER — LACTATED RINGERS IV SOLN
INTRAVENOUS | Status: DC
  Administered 2014-09-14 (×3): via INTRAVENOUS

## 2014-09-14 MED ORDER — SODIUM CHLORIDE 0.9 % IV SOLN: INTRAVENOUS | Status: DC

## 2014-09-14 SURGICAL SUPPLY — 43 items
BAG DECANTER FOR FLEXI CONT (MISCELLANEOUS) ×2 IMPLANT
BAG SPEC THK2 15X12 ZIP CLS (MISCELLANEOUS)
BAG ZIPLOCK 12X15 (MISCELLANEOUS) IMPLANT
BLADE EXTENDED COATED 6.5IN (ELECTRODE) ×2 IMPLANT
BLADE SAG 18X100X1.27 (BLADE) ×2 IMPLANT
CAPT HIP TOTAL 2 ×1 IMPLANT
COVER PERINEAL POST (MISCELLANEOUS) ×2 IMPLANT
DECANTER SPIKE VIAL GLASS SM (MISCELLANEOUS) ×2 IMPLANT
DRAPE C-ARM 42X120 X-RAY (DRAPES) ×2 IMPLANT
DRAPE STERI IOBAN 125X83 (DRAPES) ×2 IMPLANT
DRAPE U-SHAPE 47X51 STRL (DRAPES) ×6 IMPLANT
DRSG ADAPTIC 3X8 NADH LF (GAUZE/BANDAGES/DRESSINGS) ×2 IMPLANT
DRSG MEPILEX BORDER 4X12 (GAUZE/BANDAGES/DRESSINGS) ×1 IMPLANT
DRSG MEPILEX BORDER 4X4 (GAUZE/BANDAGES/DRESSINGS) ×2 IMPLANT
DRSG MEPILEX BORDER 4X8 (GAUZE/BANDAGES/DRESSINGS) ×2 IMPLANT
DURAPREP 26ML APPLICATOR (WOUND CARE) ×2 IMPLANT
ELECT REM PT RETURN 9FT ADLT (ELECTROSURGICAL) ×2
ELECTRODE REM PT RTRN 9FT ADLT (ELECTROSURGICAL) ×1 IMPLANT
EVACUATOR 1/8 PVC DRAIN (DRAIN) ×2 IMPLANT
FACESHIELD WRAPAROUND (MASK) ×8 IMPLANT
FACESHIELD WRAPAROUND OR TEAM (MASK) ×4 IMPLANT
GLOVE BIO SURGEON STRL SZ7.5 (GLOVE) ×2 IMPLANT
GLOVE BIO SURGEON STRL SZ8 (GLOVE) ×4 IMPLANT
GLOVE BIOGEL PI IND STRL 8 (GLOVE) ×2 IMPLANT
GLOVE BIOGEL PI INDICATOR 8 (GLOVE) ×2
GOWN STRL REUS W/TWL LRG LVL3 (GOWN DISPOSABLE) ×2 IMPLANT
GOWN STRL REUS W/TWL XL LVL3 (GOWN DISPOSABLE) ×2 IMPLANT
KIT BASIN OR (CUSTOM PROCEDURE TRAY) ×2 IMPLANT
NDL SAFETY ECLIPSE 18X1.5 (NEEDLE) ×1 IMPLANT
NEEDLE HYPO 18GX1.5 SHARP (NEEDLE) ×2
PACK TOTAL JOINT (CUSTOM PROCEDURE TRAY) ×2 IMPLANT
PEN SKIN MARKING BROAD (MISCELLANEOUS) ×2 IMPLANT
STRIP CLOSURE SKIN 1/2X4 (GAUZE/BANDAGES/DRESSINGS) ×3 IMPLANT
SUT ETHIBOND NAB CT1 #1 30IN (SUTURE) ×2 IMPLANT
SUT MNCRL AB 4-0 PS2 18 (SUTURE) ×2 IMPLANT
SUT VIC AB 2-0 CT1 27 (SUTURE) ×4
SUT VIC AB 2-0 CT1 TAPERPNT 27 (SUTURE) ×2 IMPLANT
SUT VLOC 180 0 24IN GS25 (SUTURE) ×2 IMPLANT
SYR 30ML LL (SYRINGE) ×2 IMPLANT
SYR 50ML LL SCALE MARK (SYRINGE) IMPLANT
TOWEL OR 17X26 10 PK STRL BLUE (TOWEL DISPOSABLE) ×2 IMPLANT
TOWEL OR NON WOVEN STRL DISP B (DISPOSABLE) IMPLANT
TRAY FOLEY W/METER SILVER 14FR (SET/KITS/TRAYS/PACK) ×2 IMPLANT

## 2014-09-14 NOTE — Interval H&P Note (Signed)
History and Physical Interval Note:  09/14/2014 9:41 AM  Jerry [name]  has presented today for surgery, with the diagnosis of right hip osteoarthritis  The various methods of treatment have been discussed with the patient and family. After consideration of risks, benefits and other options for treatment, the patient has consented to  Procedure(s): RIGHT TOTAL HIP ARTHROPLASTY ANTERIOR APPROACH (Right) as a surgical intervention .  The patient's history has been reviewed, patient examined, no change in status, stable for surgery.  I have reviewed the patient's chart and labs.  Questions were answered to the patient's satisfaction.     Loanne Drilling

## 2014-09-14 NOTE — Anesthesia Procedure Notes (Signed)
Spinal Patient location during procedure: OR End time: 09/14/2014 11:13 AM Staffing Resident/CRNA: Enrigue Catena E Performed by: anesthesiologist  Preanesthetic Checklist Completed: patient identified, site marked, surgical consent, pre-op evaluation, timeout performed, IV checked, risks and benefits discussed and monitors and equipment checked Spinal Block Patient position: sitting Prep: Betadine Patient monitoring: heart rate, continuous pulse ox and blood pressure Approach: right paramedian Location: L3-4 Injection technique: single-shot Needle Needle type: Spinocan  Needle gauge: 22 G Needle length: 9 cm Assessment Sensory level: T4 Additional Notes Expiration date of kit checked and confirmed. Patient tolerated procedure well, without complications.CSFx3, negative heme or paresthesia

## 2014-09-14 NOTE — Clinical Social Work Note (Signed)
Clinical Social Work Assessment  Patient Details  Name: Jerry [name] MRN: [id number] Date of Birth: 04-09-1930  Date of referral:  09/14/14               Reason for consult:  Discharge Planning, Facility Placement                Permission sought to share information with:    Permission granted to share information::     Name::        Agency::     Relationship::     Contact Information:     Housing/Transportation Living arrangements for the past 2 months:  Single Family Home Source of Information:  Patient Patient Interpreter Needed:  None Criminal Activity/Legal Involvement Pertinent to Current Situation/Hospitalization:  No - Comment as needed Significant Relationships:  Other Family Members Lives with:  Self Do you feel safe going back to the place where you live?   (SNF placement required.) Need for family participation in patient care:  No (Coment)  Care giving concerns:  Pt is unable to manage at home, alone , following surgery.   Social Worker assessment / plan:  Pt admitted on 09/14/14 for planned right total hip arthroplasty. Pt has made prior arrangements to have ST Rehab at [facility] following hospital d/c. CSW has left a message for Admissions Coordinator at SNF to confirm d/c plan. CSW will continue to follow to assist with d/c planning to SNF.  Employment status:  Retired Health and safety inspector:  Medicare PT Recommendations:  Not assessed at this time Information / Referral to community resources:   Engineer, maintenance for SNF and ambulance transport reviewed.)  Patient/Family's Response to care:   Pt / family feel ST Rehab is needed prior to returning home.  Patient/Family's Understanding of and Emotional Response to Diagnosis, Current Treatment, and Prognosis:  Pt / family are pleased surgery went well. Pt is a little anxious about staring therapy. Reassurance provided. Pt is looking forward to having rehab at [facility].  Emotional Assessment Appearance:   Appears stated age Attitude/Demeanor/Rapport:  Other (cooperative) Affect (typically observed):  Pleasant, Calm Orientation:  Oriented to Self, Oriented to Place, Oriented to  Time Alcohol / Substance use:  Not Applicable Psych involvement (Current and /or in the community):  No (Comment)  Discharge Needs  Concerns to be addressed:  Discharge Planning Concerns Readmission within the last 30 days:  No Current discharge risk:  None Barriers to Discharge:  No Barriers Identified   Sylvie Mifsud, Dickey Gave, LCSW 09/14/2014, 4:49 PM

## 2014-09-14 NOTE — Transfer of Care (Signed)
Immediate Anesthesia Transfer of Care Note  Patient: Jerry [name]  Procedure(s) Performed: Procedure(s): RIGHT TOTAL HIP ARTHROPLASTY ANTERIOR APPROACH (Right)  Patient Location: PACU  Anesthesia Type:Spinal  Level of Consciousness: awake, alert , oriented and patient cooperative  Airway & Oxygen Therapy: Patient Spontanous Breathing and Patient connected to face mask oxygen  Post-op Assessment: Report given to RN, Post -op Vital signs reviewed and stable and Patient moving all extremities X 4  Post vital signs: stable  Last Vitals:  Filed Vitals:   09/14/14 0814  BP: 157/54  Pulse: 57  Temp: 36.4 C  Resp: 20    Complications: No apparent anesthesia complications  Spinal level L3

## 2014-09-14 NOTE — Anesthesia Preprocedure Evaluation (Signed)
Anesthesia Evaluation  Patient identified by MRN, date of birth, ID band Patient awake    Reviewed: Allergy & Precautions, H&P , NPO status , Patient's Chart, lab work & pertinent test results, reviewed documented beta blocker date and time   Airway Mallampati: II  TM Distance: >3 FB Neck ROM: Full    Dental  (+) Upper Dentures, Lower Dentures   Pulmonary neg pulmonary ROS, former smoker,  breath sounds clear to auscultation        Cardiovascular hypertension, Pt. on medications and Pt. on home beta blockers + CAD, + Past MI and + CABG (2002) Rhythm:Regular Rate:Normal     Neuro/Psych negative neurological ROS  negative psych ROS   GI/Hepatic Neg liver ROS, hiatal hernia,   Endo/Other  negative endocrine ROS  Renal/GU negative Renal ROS  negative genitourinary   Musculoskeletal negative musculoskeletal ROS (+)   Abdominal   Peds negative pediatric ROS (+)  Hematology   Anesthesia Other Findings   Reproductive/Obstetrics negative OB ROS                             Anesthesia Physical  Anesthesia Plan  ASA: III  Anesthesia Plan: Spinal   Post-op Pain Management:    Induction:   Airway Management Planned: Simple Face Mask  Additional Equipment:   Intra-op Plan:   Post-operative Plan:   Informed Consent: I have reviewed the patients History and Physical, chart, labs and discussed the procedure including the risks, benefits and alternatives for the proposed anesthesia with the patient or authorized representative who has indicated his/her understanding and acceptance.     Plan Discussed with: CRNA and Surgeon  Anesthesia Plan Comments:         Anesthesia Quick Evaluation

## 2014-09-14 NOTE — Clinical Social Work Placement (Signed)
   CLINICAL SOCIAL WORK PLACEMENT  NOTE  Date:  09/14/2014  Patient Details  Name: Jerry [name] MRN: [id number] Date of Birth: 28-Dec-1929  Clinical Social Work is seeking post-discharge placement for this patient at the Skilled  Nursing Facility level of care (*CSW will initial, date and re-position this form in  chart as items are completed):  No   Patient/family provided with [facility] Clinical Social Work Department's list of facilities offering this level of care within the geographic area requested by the patient (or if unable, by the patient's family).  Yes   Patient/family informed of their freedom to choose among providers that offer the needed level of care, that participate in Medicare, Medicaid or managed care program needed by the patient, have an available bed and are willing to accept the patient.      Patient/family informed of [facility]'s ownership interest in [facility] and [facility], as well as of the fact that they are under no obligation to receive care at these facilities.  PASRR submitted to EDS on       PASRR number received on       Existing PASRR number confirmed on 09/14/14     FL2 transmitted to all facilities in geographic area requested by pt/family on 09/14/14     FL2 transmitted to all facilities within larger geographic area on       Patient informed that his/her managed care company has contracts with or will negotiate with certain facilities, including the following:            Patient/family informed of bed offers received.  Patient chooses bed at       Physician recommends and patient chooses bed at      Patient to be transferred to   on  .  Patient to be transferred to facility by       Patient family notified on   of transfer.  Name of family member notified:        PHYSICIAN       Additional Comment:    _______________________________________________ Royetta Asal, LCSW 09/14/2014, 4:54 PM

## 2014-09-14 NOTE — Op Note (Signed)
OPERATIVE REPORT  PREOPERATIVE DIAGNOSIS: Osteoarthritis of the Right hip.   POSTOPERATIVE DIAGNOSIS: Osteoarthritis of the Right  hip.   PROCEDURE: Right total hip arthroplasty, anterior approach.   SURGEON: Ollen Gross, MD   ASSISTANT: Avel Peace, PA-C  ANESTHESIA:  Spinal  ESTIMATED BLOOD LOSS:-400 ml  DRAINS: Hemovac x1.   COMPLICATIONS: None   CONDITION: PACU - hemodynamically stable.   BRIEF CLINICAL NOTE: Jerry [name] is a 81 y.o. male who has advanced end-  stage arthritis of his Right  hip with progressively worsening pain and  dysfunction.The patient has failed nonoperative management and presents for  total hip arthroplasty.   PROCEDURE IN DETAIL: After successful administration of spinal  anesthetic, the traction boots for the [facility] bed were placed on both  feet and the patient was placed onto the [facility] bed, boots placed into the leg  holders. The Right hip was then isolated from the perineum with plastic  drapes and prepped and draped in the usual sterile fashion. ASIS and  greater trochanter were marked and a oblique incision was made, starting  at about 1 cm lateral and 2 cm distal to the ASIS and coursing towards  the anterior cortex of the femur. The skin was cut with a 10 blade  through subcutaneous tissue to the level of the fascia overlying the  tensor fascia lata muscle. The fascia was then incised in line with the  incision at the junction of the anterior third and posterior 2/3rd. The  muscle was teased off the fascia and then the interval between the TFL  and the rectus was developed. The Hohmann retractor was then placed at  the top of the femoral neck over the capsule. The vessels overlying the  capsule were cauterized and the fat on top of the capsule was removed.  A Hohmann retractor was then placed anterior underneath the rectus  femoris to give exposure to the entire anterior capsule. A T-shaped  capsulotomy was performed. The  edges were tagged and the femoral head  was identified.       Osteophytes are removed off the superior acetabulum.  The femoral neck was then cut in situ with an oscillating saw. Traction  was then applied to the left lower extremity utilizing the [facility]  traction. The femoral head was then removed. Retractors were placed  around the acetabulum and then circumferential removal of the labrum was  performed. Osteophytes were also removed. Reaming starts at 47 mm to  medialize and  Increased in 2 mm increments to 55 mm. We reamed in  approximately 40 degrees of abduction, 20 degrees anteversion. A 56 mm  pinnacle acetabular shell was then impacted in anatomic position under  fluoroscopic guidance with excellent purchase. We did not need to place  any additional dome screws. A 36 mm neutral + 4 marathon liner was then  placed into the acetabular shell.       The femoral lift was then placed along the lateral aspect of the femur  just distal to the vastus ridge. The leg was  externally rotated and capsule  was stripped off the inferior aspect of the femoral neck down to the  level of the lesser trochanter, this was done with electrocautery. The femur was lifted after this was performed. The  leg was then placed and extended in adducted position to essentially delivering the femur. We also removed the capsule superiorly and the  piriformis from the piriformis fossa  to gain excellent exposure of the  proximal femur. Rongeur was used to remove some cancellous bone to get  into the lateral portion of the proximal femur for placement of the  initial starter reamer. The starter broaches was placed  the starter broach  and was shown to go down the center of the canal. Broaching  with the  Corail system was then performed starting at size 8, coursing  Up to size 13. A size 13 had excellent torsional and rotational  and axial stability. The trial high offset neck was then placed  with a 36 + 5 trial head.  The hip was then reduced. We confirmed that  the stem was in the canal both on AP and lateral x-rays. It also has excellent sizing. The hip was reduced with outstanding stability through full extension, full external rotation,  and then flexion in adduction internal rotation. AP pelvis was taken  and the leg lengths were measured and found to be exactly equal. Hip  was then dislocated again and the femoral head and neck removed. The  femoral broach was removed. Size 13 Corail stem with a high offset  neck was then impacted into the femur following native anteversion. Has  excellent purchase in the canal. Excellent torsional and rotational and  axial stability. It is confirmed to be in the canal on AP and lateral  fluoroscopic views. The 36 + 5 ceramic head was placed and the hip  reduced with outstanding stability. Again AP pelvis was taken and it  confirmed that the leg lengths were equal. The wound was then copiously  irrigated with saline solution and the capsule reattached and repaired  with Ethibond suture.  20 mL of Exparel mixed with 50 mL of saline then additional 20 ml of .25% Bupivicaine injected into the capsule and into the edge of the tensor fascia lata as well as subcutaneous tissue. The fascia overlying the tensor fascia lata was  then closed with a running #1 V-Loc. Subcu was closed with interrupted  2-0 Vicryl and subcuticular running 4-0 Monocryl. Incision was cleaned  and dried. Steri-Strips and a bulky sterile dressing applied. Hemovac  drain was hooked to suction and then he was awakened and transported to  recovery in stable condition.        Please note that a surgical assistant was a medical necessity for this procedure to perform it in a safe and expeditious manner. Assistant was necessary to provide appropriate retraction of vital neurovascular structures and to prevent femoral fracture and allow for anatomic placement of the prosthesis.  Ollen Gross, M.D.

## 2014-09-14 NOTE — Evaluation (Signed)
Physical Therapy Evaluation Patient Details Name: Jerry [name] MRN: [id number] DOB: 1930/01/08 Today's Date: 09/14/2014   History of Present Illness  R THR  Clinical Impression  Pt s/p R THR presents with decreased R LE strength/ROM and post op pain limiting functional mobility.  Pt would benefit from short term SNF level rehab to maximize IND and safety prior to return home with limited assist.    Follow Up Recommendations SNF    Equipment Recommendations  None recommended by PT    Recommendations for Other Services OT consult     Precautions / Restrictions Precautions Precautions: Fall Restrictions Weight Bearing Restrictions: No Other Position/Activity Restrictions: WBAT      Mobility  Bed Mobility Overal bed mobility: Needs Assistance Bed Mobility: Supine to Sit     Supine to sit: Min assist     General bed mobility comments: cues for sequence and use of L LE to self assist  Transfers Overall transfer level: Needs assistance Equipment used: Rolling walker (2 wheeled) Transfers: Sit to/from Stand Sit to Stand: From elevated surface;Min assist         General transfer comment: cues for LE management and use of UEs to self assist  Ambulation/Gait Ambulation/Gait assistance: Min assist Ambulation Distance (Feet): 74 Feet Assistive device: Rolling walker (2 wheeled) Gait Pattern/deviations: Step-to pattern;Decreased step length - right;Decreased step length - left;Shuffle;Trunk flexed     General Gait Details: cues for sequence, posture and position from [employer]            Wheelchair Mobility    Modified Rankin (Stroke Patients Only)       Balance                                             Pertinent Vitals/Pain Pain Assessment: 0-10 Pain Score: 3  Pain Location: R hip/thigh Pain Descriptors / Indicators: Aching;Tightness Pain Intervention(s): Limited activity within patient's tolerance;Monitored during  session;Premedicated before session;Ice applied    Home Living Family/patient expects to be discharged to:: Skilled nursing facility Living Arrangements: Alone                    Prior Function Level of Independence: Independent               Hand Dominance   Dominant Hand: Right    Extremity/Trunk Assessment   Upper Extremity Assessment: Overall WFL for tasks assessed           Lower Extremity Assessment: RLE deficits/detail RLE Deficits / Details: 3-/5 hip with AAROM at hip to 90 flex and 15 abd    Cervical / Trunk Assessment: Normal  Communication   Communication: No difficulties  Cognition Arousal/Alertness: Awake/alert Behavior During Therapy: WFL for tasks assessed/performed Overall Cognitive Status: Within Functional Limits for tasks assessed                      General Comments      Exercises Total Joint Exercises Ankle Circles/Pumps: AROM;Both;15 reps;Supine Heel Slides: AAROM;Right;15 reps;Supine Hip ABduction/ADduction: AAROM;Right;10 reps;Supine      Assessment/Plan    PT Assessment Patient needs continued PT services  PT Diagnosis Difficulty walking   PT Problem List Decreased strength;Decreased range of motion;Decreased activity tolerance;Decreased mobility;Decreased knowledge of use of DME;Pain  PT Treatment Interventions DME instruction;Gait training;Stair training;Functional mobility training;Therapeutic exercise;Therapeutic activities;Patient/family education   PT Goals (  Current goals can be found in the Care Plan section) Acute Rehab PT Goals Patient Stated Goal: Rehab and then home alone to resume previous lifestyle with decreased pain PT Goal Formulation: With patient Time For Goal Achievement: 09/20/14 Potential to Achieve Goals: Good    Frequency 7X/week   Barriers to discharge        Co-evaluation               End of Session Equipment Utilized During Treatment: Gait belt Activity Tolerance:  Patient tolerated treatment well Patient left: in chair;with call bell/phone within reach;with family/visitor present Nurse Communication: Mobility status         Time: 1610-9604 PT Time Calculation (min) (ACUTE ONLY): 24 min   Charges:   PT Evaluation $Initial PT Evaluation Tier I: 1 Procedure PT Treatments $Gait Training: 8-22 mins   PT G Codes:        Fronia Depass 2014/09/15, 5:39 PM

## 2014-09-14 NOTE — Anesthesia Postprocedure Evaluation (Signed)
  Anesthesia Post-op Note  Patient: Jerry [name]  Procedure(s) Performed: Procedure(s) (LRB): RIGHT TOTAL HIP ARTHROPLASTY ANTERIOR APPROACH (Right)  Patient Location: PACU  Anesthesia Type: Spinal  Level of Consciousness: awake and alert   Airway and Oxygen Therapy: Patient Spontanous Breathing  Post-op Pain: mild  Post-op Assessment: Post-op Vital signs reviewed, Patient's Cardiovascular Status Stable, Respiratory Function Stable, Patent Airway and No signs of Nausea or vomiting  Last Vitals:  Filed Vitals:   09/14/14 1703  BP: 132/58  Pulse: 55  Temp: 36.8 C  Resp: 15    Post-op Vital Signs: stable   Complications: No apparent anesthesia complications

## 2014-09-15 LAB — BASIC METABOLIC PANEL
Anion gap: 7 (ref 5–15)
BUN: 16 mg/dL (ref 6–20)
CALCIUM: 8.5 mg/dL — AB (ref 8.9–10.3)
CO2: 28 mmol/L (ref 22–32)
Chloride: 104 mmol/L (ref 101–111)
Creatinine, Ser: 1.15 mg/dL (ref 0.61–1.24)
GFR, EST NON AFRICAN AMERICAN: 57 mL/min — AB (ref >60)
GLUCOSE: 195 mg/dL — AB (ref 65–99)
Potassium: 4.4 mmol/L (ref 3.5–5.1)
Sodium: 139 mmol/L (ref 135–145)

## 2014-09-15 LAB — CBC
HEMATOCRIT: 34.7 % — AB (ref 39.0–52.0)
Hemoglobin: 11.3 g/dL — ABNORMAL LOW (ref 13.0–17.0)
MCH: 28.5 pg (ref 26.0–34.0)
MCHC: 32.6 g/dL (ref 30.0–36.0)
MCV: 87.4 fL (ref 78.0–100.0)
Platelets: 185 10*3/uL (ref 150–400)
RBC: 3.97 MIL/uL — AB (ref 4.22–5.81)
RDW: 13.9 % (ref 11.5–15.5)
WBC: 10.8 10*3/uL — ABNORMAL HIGH (ref 4.0–10.5)

## 2014-09-15 MED ORDER — METOCLOPRAMIDE HCL 5 MG/ML IJ SOLN
5.0000 mg | Freq: Four times a day (QID) | INTRAMUSCULAR | Status: AC
  Administered 2014-09-15 (×2): 5 mg via INTRAVENOUS
  Filled 2014-09-15: qty 2

## 2014-09-15 MED ORDER — CHLORPROMAZINE HCL 25 MG PO TABS
25.0000 mg | ORAL_TABLET | Freq: Three times a day (TID) | ORAL | Status: DC | PRN
  Filled 2014-09-15: qty 1

## 2014-09-15 MED ORDER — BACLOFEN 10 MG PO TABS
10.0000 mg | ORAL_TABLET | Freq: Three times a day (TID) | ORAL | Status: AC
  Administered 2014-09-15: 10 mg via ORAL
  Filled 2014-09-15 (×2): qty 1

## 2014-09-15 MED ORDER — CYCLOBENZAPRINE HCL 5 MG PO TABS: 5.0000 mg | ORAL_TABLET | Freq: Three times a day (TID) | ORAL | Status: DC | PRN

## 2014-09-15 NOTE — Discharge Instructions (Addendum)
Dr. Gaynelle Arabian Total Joint Specialist [facility] [address], [place], [place] [address] [phone]  ANTERIOR APPROACH TOTAL HIP REPLACEMENT POSTOPERATIVE DIRECTIONS   Hip Rehabilitation, Guidelines Following Surgery  The results of a hip operation are greatly improved after range of motion and muscle strengthening exercises. Follow all safety measures which are given to protect your hip. If any of these exercises cause increased pain or swelling in your joint, decrease the amount until you are comfortable again. Then slowly increase the exercises. Call your caregiver if you have problems or questions.   HOME CARE INSTRUCTIONS  Remove items at home which could result in a fall. This includes throw rugs or furniture in walking pathways.   ICE to the affected hip every three hours for 30 minutes at a time and then as needed for pain and swelling.  Continue to use ice on the hip for pain and swelling from surgery. You may notice swelling that will progress down to the foot and ankle.  This is normal after surgery.  Elevate the leg when you are not up walking on it.    Continue to use the breathing machine which will help keep your temperature down.  It is common for your temperature to cycle up and down following surgery, especially at night when you are not up moving around and exerting yourself.  The breathing machine keeps your lungs expanded and your temperature down.  Do not place pillow under knee, focus on keeping the knee straight while resting  DIET You may resume your previous home diet once your are discharged from the hospital.  DRESSING / WOUND CARE / SHOWERING You may change your dressing 3-5 days after surgery.  Then change the dressing every day with sterile gauze.  Please use good hand washing techniques before changing the dressing.  Do not use any lotions or creams on the incision until instructed by your surgeon. and You may shower 3 days  after surgery, but keep the wounds dry during showering.  You may use an occlusive plastic wrap (Press'n Seal for example), NO SOAKING/SUBMERGING IN THE BATHTUB.  If the bandage gets wet, change with a clean dry gauze.  If the incision gets wet, pat the wound dry with a clean towel. You may start showering once you are discharged home but do not submerge the incision under water. Just pat the incision dry and apply a dry gauze dressing on daily. Change the surgical dressing daily and reapply a dry dressing each time.  ACTIVITY Walk with your walker as instructed. Use walker as long as suggested by your caregivers. Avoid periods of inactivity such as sitting longer than an hour when not asleep. This helps prevent blood clots.  You may resume a sexual relationship in one month or when given the OK by your doctor.  You may return to work once you are cleared by your doctor.  Do not drive a car for 6 weeks or until released by you surgeon.  Do not drive while taking narcotics.  WEIGHT BEARING Weight bearing as tolerated with assist device (walker, cane, etc) as directed, use it as long as suggested by your surgeon or therapist, typically at least 4-6 weeks.  POSTOPERATIVE CONSTIPATION PROTOCOL Constipation - defined medically as fewer than three stools per week and severe constipation as less than one stool per week.  One of the most common issues patients have following surgery is constipation.  Even if you have a regular bowel pattern at  home, your normal regimen is likely to be disrupted due to multiple reasons following surgery.  Combination of anesthesia, postoperative narcotics, change in appetite and fluid intake all can affect your bowels.  In order to avoid complications following surgery, here are some recommendations in order to help you during your recovery period.  Colace (docusate) - Pick up an over-the-counter form of Colace or another stool softener and take twice a day as long as  you are requiring postoperative pain medications.  Take with a full glass of water daily.  If you experience loose stools or diarrhea, hold the colace until you stool forms back up.  If your symptoms do not get better within 1 week or if they get worse, check with your doctor.  Dulcolax (bisacodyl) - Pick up over-the-counter and take as directed by the product packaging as needed to assist with the movement of your bowels.  Take with a full glass of water.  Use this product as needed if not relieved by Colace only.   MiraLax (polyethylene glycol) - Pick up over-the-counter to have on hand.  MiraLax is a solution that will increase the amount of water in your bowels to assist with bowel movements.  Take as directed and can mix with a glass of water, juice, soda, coffee, or tea.  Take if you go more than two days without a movement. Do not use MiraLax more than once per day. Call your doctor if you are still constipated or irregular after using this medication for 7 days in a row.  If you continue to have problems with postoperative constipation, please contact the office for further assistance and recommendations.  If you experience "the worst abdominal pain ever" or develop nausea or vomiting, please contact the office immediatly for further recommendations for treatment.  ITCHING  If you experience itching with your medications, try taking only a single pain pill, or even half a pain pill at a time.  You can also use Benadryl over the counter for itching or also to help with sleep.   TED HOSE STOCKINGS Wear the elastic stockings on both legs for three weeks following surgery during the day but you may remove then at night for sleeping.  MEDICATIONS See your medication summary on the After Visit Summary that the nursing staff will review with you prior to discharge.  You may have some home medications which will be placed on hold until you complete the course of blood thinner medication.  It is  important for you to complete the blood thinner medication as prescribed by your surgeon.  Continue your approved medications as instructed at time of discharge.  PRECAUTIONS If you experience chest pain or shortness of breath - call 911 immediately for transfer to the hospital emergency department.  If you develop a fever greater that 101 F, purulent drainage from wound, increased redness or drainage from wound, foul odor from the wound/dressing, or calf pain - CONTACT YOUR SURGEON.                                                   FOLLOW-UP APPOINTMENTS Make sure you keep all of your appointments after your operation with your surgeon and caregivers. You should call the office at the above phone number and make an appointment for approximately two weeks after the date of your surgery or  on the date instructed by your surgeon outlined in the "After Visit Summary".  RANGE OF MOTION AND STRENGTHENING EXERCISES  These exercises are designed to help you keep full movement of your hip joint. Follow your caregiver's or physical therapist's instructions. Perform all exercises about fifteen times, three times per day or as directed. Exercise both hips, even if you have had only one joint replacement. These exercises can be done on a training (exercise) mat, on the floor, on a table or on a bed. Use whatever works the best and is most comfortable for you. Use music or television while you are exercising so that the exercises are a pleasant break in your day. This will make your life better with the exercises acting as a break in routine you can look forward to.  Lying on your back, slowly slide your foot toward your buttocks, raising your knee up off the floor. Then slowly slide your foot back down until your leg is straight again.  Lying on your back spread your legs as far apart as you can without causing discomfort.  Lying on your side, raise your upper leg and foot straight up from the floor as far as is  comfortable. Slowly lower the leg and repeat.  Lying on your back, tighten up the muscle in the front of your thigh (quadriceps muscles). You can do this by keeping your leg straight and trying to raise your heel off the floor. This helps strengthen the largest muscle supporting your knee.  Lying on your back, tighten up the muscles of your buttocks both with the legs straight and with the knee bent at a comfortable angle while keeping your heel on the floor.   IF YOU ARE TRANSFERRED TO A SKILLED REHAB FACILITY If the patient is transferred to a skilled rehab facility following release from the hospital, a list of the current medications will be sent to the facility for the patient to continue.  When discharged from the skilled rehab facility, please have the facility set up the patient's Home Health Physical Therapy prior to being released. Also, the skilled facility will be responsible for providing the patient with their medications at time of release from the facility to include their pain medication, the muscle relaxants, and their blood thinner medication. If the patient is still at the rehab facility at time of the two week follow up appointment, the skilled rehab facility will also need to assist the patient in arranging follow up appointment in our office and any transportation needs.  MAKE SURE YOU:  Understand these instructions.  Get help right away if you are not doing well or get worse.    Pick up stool softner and laxative for home use following surgery while on pain medications. Do not submerge incision under water. Please use good hand washing techniques while changing dressing each day. May shower starting three days after surgery. Please use a clean towel to pat the incision dry following showers. Continue to use ice for pain and swelling after surgery. Do not use any lotions or creams on the incision until instructed by your surgeon.  Take Xarelto for two and a half more  weeks, then discontinue Xarelto. Once the patient has completed the Xarelto, they may resume the 81 mg Aspirin.  Postoperative Constipation Protocol  Constipation - defined medically as fewer than three stools per week and severe constipation as less than one stool per week.  One of the most common issues patients have following surgery is  constipation.  Even if you have a regular bowel pattern at home, your normal regimen is likely to be disrupted due to multiple reasons following surgery.  Combination of anesthesia, postoperative narcotics, change in appetite and fluid intake all can affect your bowels.  In order to avoid complications following surgery, here are some recommendations in order to help you during your recovery period.  Colace (docusate) - Pick up an over-the-counter form of Colace or another stool softener and take twice a day as long as you are requiring postoperative pain medications.  Take with a full glass of water daily.  If you experience loose stools or diarrhea, hold the colace until you stool forms back up.  If your symptoms do not get better within 1 week or if they get worse, check with your doctor.  Dulcolax (bisacodyl) - Pick up over-the-counter and take as directed by the product packaging as needed to assist with the movement of your bowels.  Take with a full glass of water.  Use this product as needed if not relieved by Colace only.   MiraLax (polyethylene glycol) - Pick up over-the-counter to have on hand.  MiraLax is a solution that will increase the amount of water in your bowels to assist with bowel movements.  Take as directed and can mix with a glass of water, juice, soda, coffee, or tea.  Take if you go more than two days without a movement. Do not use MiraLax more than once per day. Call your doctor if you are still constipated or irregular after using this medication for 7 days in a row.  If you continue to have problems with postoperative constipation,  please contact the office for further assistance and recommendations.  If you experience "the worst abdominal pain ever" or develop nausea or vomiting, please contact the office immediatly for further recommendations for treatment.   Information on my medicine - XARELTO (Rivaroxaban)  This medication education was reviewed with me or my healthcare representative as part of my discharge preparation.  The pharmacist that spoke with me during my hospital stay was:  Clance Boll, [facility]  Why was Xarelto prescribed for you? Xarelto was prescribed for you to reduce the risk of blood clots forming after orthopedic surgery. The medical term for these abnormal blood clots is venous thromboembolism (VTE).  What do you need to know about xarelto ? Take your Xarelto ONCE DAILY at the same time every day. You may take it either with or without food.  If you have difficulty swallowing the tablet whole, you may crush it and mix in applesauce just prior to taking your dose.  Take Xarelto exactly as prescribed by your doctor and DO NOT stop taking Xarelto without talking to the doctor who prescribed the medication.  Stopping without other VTE prevention medication to take the place of Xarelto may increase your risk of developing a clot.  After discharge, you should have regular check-up appointments with your healthcare provider that is prescribing your Xarelto.    What do you do if you miss a dose? If you miss a dose, take it as soon as you remember on the same day then continue your regularly scheduled once daily regimen the next day. Do not take two doses of Xarelto on the same day.   Important Safety Information A possible side effect of Xarelto is bleeding. You should call your healthcare provider right away if you experience any of the following: ? Bleeding from an injury or your nose that  does not stop. ? Unusual colored urine (red or dark brown) or unusual colored stools (red or  black). ? Unusual bruising for unknown reasons. ? A serious fall or if you hit your head (even if there is no bleeding).  Some medicines may interact with Xarelto and might increase your risk of bleeding while on Xarelto. To help avoid this, consult your healthcare provider or pharmacist prior to using any new prescription or non-prescription medications, including herbals, vitamins, non-steroidal anti-inflammatory drugs (NSAIDs) and supplements.  This website has more information on Xarelto: VisitDestination.com.br.

## 2014-09-15 NOTE — Progress Notes (Signed)
   Subjective: 1 Day Post-Op Procedure(s) (LRB): RIGHT TOTAL HIP ARTHROPLASTY ANTERIOR APPROACH (Right) Patient reports pain as mild and moderate.   Patient seen in rounds by Dr. Lequita Halt. Patient is well, but has had some minor complaints of pain in the hip, requiring pain medications We will start therapy today.  Plan is to go Skilled nursing facility after hospital stay.  Objective: Vital signs in last 24 hours: Temp:  [96.3 F (35.7 C)-98.3 F (36.8 C)] 98.3 F (36.8 C) (05/19 0624) Pulse Rate:  [51-74] 74 (05/19 0624) Resp:  [12-20] 16 (05/19 0624) BP: (109-157)/(47-84) 156/47 mmHg (05/19 0624) SpO2:  [96 %-100 %] 98 % (05/19 0624) Weight:  [95.255 kg (210 lb)] 95.255 kg (210 lb) (05/18 0838)  Intake/Output from previous day:  Intake/Output Summary (Last 24 hours) at 09/15/14 0738 Last data filed at 09/15/14 0624  Gross per 24 hour  Intake   4220 ml  Output   2790 ml  Net   1430 ml    Intake/Output this shift: UOP 850 since around [place]  Labs:  Recent Labs  09/15/14 0410  HGB 11.3*    Recent Labs  09/15/14 0410  WBC 10.8*  RBC 3.97*  HCT 34.7*  PLT 185    Recent Labs  09/15/14 0410  NA 139  K 4.4  CL 104  CO2 28  BUN 16  CREATININE 1.15  GLUCOSE 195*  CALCIUM 8.5*   No results for input(s): LABPT, INR in the last 72 hours.  EXAM General - Patient is Alert and Appropriate Extremity - Neurovascular intact Sensation intact distally Dorsiflexion/Plantar flexion intact Dressing - dressing C/D/I Motor Function - intact, moving foot and toes well on exam.  Hemovac pulled without difficulty.  Past Medical History  Diagnosis Date  . Dyslipidemia     under control  . Medication intolerance     NIACIN  . CAD (coronary artery disease)     no ischemia nuclear scan 12/2005 EF60% /  nuclear, October, 2012, no ischemia, EF normal  . Hiatal hernia   . Peptic ulcer disease     "just one little ulcer"  . Hypertension   . Hx of CABG     2002  .  Ejection fraction     EF. 60%, Nuclear, 2007 /   EF, 60%, nuclear, October, 2012  . Carotid artery disease     Doppler, October, 2011, 40-59% bilateral  . Weight loss     August, 2012  . Myocardial infarction     1996  . Arthritis     knees and shoulders   . Hearing aid worn     bilateral ears   . Pneumonia     hx of x1  . GERD (gastroesophageal reflux disease)     Assessment/Plan: 1 Day Post-Op Procedure(s) (LRB): RIGHT TOTAL HIP ARTHROPLASTY ANTERIOR APPROACH (Right) Principal Problem:   OA (osteoarthritis) of hip  Estimated body mass index is 27.71 kg/(m^2) as calculated from the following:   Height as of this encounter: 6\' 1"  (1.854 m).   Weight as of this encounter: 95.255 kg (210 lb). Advance diet Up with therapy Plan for discharge tomorrow Discharge to SNF - plan for [facility]  DVT Prophylaxis - Xarelto Weight Bearing As Tolerated right Leg Hemovac Pulled Begin Therapy  Avel Peace, PA-C Orthopaedic Surgery 09/15/2014, 7:38 AM

## 2014-09-15 NOTE — Progress Notes (Signed)
Physical Therapy Treatment Patient Details Name: Jerry [name] MRN: [id number] DOB: [birth year] Today's Date: 10/12/2014    History of Present Illness R THR    PT Comments    Pt very motivated and making steady progress with mobility.  Follow Up Recommendations  SNF     Equipment Recommendations  None recommended by PT    Recommendations for Other Services OT consult     Precautions / Restrictions Precautions Precautions: Fall Restrictions Weight Bearing Restrictions: No Other Position/Activity Restrictions: WBAT    Mobility  Bed Mobility                  Transfers Overall transfer level: Needs assistance Equipment used: Rolling walker (2 wheeled) Transfers: Sit to/from Stand Sit to Stand: Min assist         General transfer comment: cues for LE management and use of UEs to self assist  Ambulation/Gait Ambulation/Gait assistance: Min assist Ambulation Distance (Feet): 100 Feet (twice) Assistive device: Rolling walker (2 wheeled) Gait Pattern/deviations: Step-to pattern;Step-through pattern;Decreased step length - right;Decreased step length - left;Shuffle;Trunk flexed     General Gait Details: cues for sequence, posture, ER on R and position from [employer] Mobility    Modified Rankin (Stroke Patients Only)       Balance                                    Cognition Arousal/Alertness: Awake/alert Behavior During Therapy: WFL for tasks assessed/performed Overall Cognitive Status: Within Functional Limits for tasks assessed                      Exercises      General Comments        Pertinent Vitals/Pain Pain Assessment: 0-10 Pain Score: 3  Pain Location: R hip/thigh Pain Descriptors / Indicators: Aching;Sore Pain Intervention(s): Limited activity within patient's tolerance;Monitored during session;Ice applied    Home Living                      Prior Function             PT Goals (current goals can now be found in the care plan section) Acute Rehab PT Goals Patient Stated Goal: Rehab and then home alone to resume previous lifestyle with decreased pain PT Goal Formulation: With patient Time For Goal Achievement: 09/20/14 Potential to Achieve Goals: Good Progress towards PT goals: Progressing toward goals    Frequency  7X/week    PT Plan Current plan remains appropriate    Co-evaluation             End of Session Equipment Utilized During Treatment: Gait belt Activity Tolerance: Patient tolerated treatment well Patient left: in chair;with call bell/phone within reach;with family/visitor present     Time: 1410-1430 PT Time Calculation (min) (ACUTE ONLY): 20 min  Charges:  $Gait Training: 8-22 mins                    G Codes:      Jamall Strohmeier 12-Oct-2014, 2:48 PM

## 2014-09-15 NOTE — Progress Notes (Signed)
OT Cancellation Note  Patient Details Name: JACOREY SPACE MRN: [id number] DOB: [birth year]   Cancelled Treatment:    Reason Eval/Treat Not Completed: Other (comment).  Pt plans [facility] for rehab. Will defer OT to that venue.  Shaylinn Hladik 09/15/2014, 7:29 AM  Marica Otter, OTR/L [phone] 09/15/2014

## 2014-09-15 NOTE — Progress Notes (Signed)
Physical Therapy Treatment Patient Details Name: Jerry [name] MRN: [id number] DOB: [birth year] Today's Date: 2014-10-14    History of Present Illness R THR    PT Comments    Pt motivated and making steady progress with mobility.  Follow Up Recommendations  SNF     Equipment Recommendations  None recommended by PT    Recommendations for Other Services OT consult     Precautions / Restrictions Precautions Precautions: Fall Restrictions Weight Bearing Restrictions: No Other Position/Activity Restrictions: WBAT    Mobility  Bed Mobility               General bed mobility comments: Pt OOB wtih RN and declined return to bed  Transfers Overall transfer level: Needs assistance Equipment used: Rolling walker (2 wheeled) Transfers: Sit to/from Stand Sit to Stand: Min assist         General transfer comment: cues for LE management and use of UEs to self assist  Ambulation/Gait Ambulation/Gait assistance: Min assist;Min guard Ambulation Distance (Feet): 85 Feet (twice) Assistive device: Rolling walker (2 wheeled) Gait Pattern/deviations: Step-to pattern;Step-through pattern;Decreased step length - right;Decreased step length - left;Shuffle;Trunk flexed     General Gait Details: cues for sequence, posture, ER on R and position from [employer] Mobility    Modified Rankin (Stroke Patients Only)       Balance                                    Cognition Arousal/Alertness: Awake/alert Behavior During Therapy: WFL for tasks assessed/performed Overall Cognitive Status: Within Functional Limits for tasks assessed                      Exercises Total Joint Exercises Ankle Circles/Pumps: AROM;Both;15 reps;Supine Quad Sets: AROM;Both;10 reps;Supine Heel Slides: AAROM;Right;Supine;20 reps Hip ABduction/ADduction: AAROM;Right;Supine;15 reps Long Arc Quad: AROM;Both;10 reps;Seated    General Comments         Pertinent Vitals/Pain Pain Assessment: 0-10 Pain Score: 2  Pain Location: R hip/thigh Pain Descriptors / Indicators: Aching;Sore Pain Intervention(s): Limited activity within patient's tolerance;Monitored during session;Premedicated before session;Ice applied    Home Living                      Prior Function            PT Goals (current goals can now be found in the care plan section) Acute Rehab PT Goals Patient Stated Goal: Rehab and then home alone to resume previous lifestyle with decreased pain PT Goal Formulation: With patient Time For Goal Achievement: 09/20/14 Potential to Achieve Goals: Good Progress towards PT goals: Progressing toward goals    Frequency  7X/week    PT Plan Current plan remains appropriate    Co-evaluation             End of Session Equipment Utilized During Treatment: Gait belt Activity Tolerance: Patient tolerated treatment well Patient left: in chair;with call bell/phone within reach     Time: 0569-7948 PT Time Calculation (min) (ACUTE ONLY): 29 min  Charges:  $Gait Training: 8-22 mins $Therapeutic Exercise: 8-22 mins                    G Codes:      [name] October 14, 2014, 12:52 PM

## 2014-09-15 NOTE — Care Management Note (Signed)
Case Management Note  Patient Details  Name: KHRIZ BAILIE MRN: [id number] Date of Birth: [birth year]  Subjective/Objective:                   RIGHT TOTAL HIP ARTHROPLASTY ANTERIOR APPROACH (Right) Action/Plan:  Discharge planning Expected Discharge Date:  09/17/14               Expected Discharge Plan:  Skilled Nursing Facility  In-House Referral:     Discharge planning Services  CM Consult  Post Acute Care Choice:    Choice offered to:     DME Arranged:    DME Agency:     HH Arranged:    HH Agency:     Status of Service:  Completed, signed off  Medicare Important Message Given:    Date Medicare IM Given:    Medicare IM give by:    Date Additional Medicare IM Given:    Additional Medicare Important Message give by:     If discussed at Long Length of Stay Meetings, dates discussed:    Additional Comments: CM notes pt to go to SNF post-discharge; CSW arranging. Yves Dill, RN 09/15/2014, 10:42 AM

## 2014-09-16 LAB — CBC
HCT: 32.8 % — ABNORMAL LOW (ref 39.0–52.0)
HEMOGLOBIN: 10.8 g/dL — AB (ref 13.0–17.0)
MCH: 29 pg (ref 26.0–34.0)
MCHC: 32.9 g/dL (ref 30.0–36.0)
MCV: 88.2 fL (ref 78.0–100.0)
Platelets: 190 10*3/uL (ref 150–400)
RBC: 3.72 MIL/uL — AB (ref 4.22–5.81)
RDW: 14.2 % (ref 11.5–15.5)
WBC: 15.1 10*3/uL — ABNORMAL HIGH (ref 4.0–10.5)

## 2014-09-16 LAB — BASIC METABOLIC PANEL
ANION GAP: 7 (ref 5–15)
BUN: 20 mg/dL (ref 6–20)
CALCIUM: 8.5 mg/dL — AB (ref 8.9–10.3)
CO2: 28 mmol/L (ref 22–32)
CREATININE: 1.03 mg/dL (ref 0.61–1.24)
Chloride: 107 mmol/L (ref 101–111)
GFR calc Af Amer: >60 mL/min (ref >60)
GFR calc non Af Amer: >60 mL/min (ref >60)
Glucose, Bld: 163 mg/dL — ABNORMAL HIGH (ref 65–99)
Potassium: 4.3 mmol/L (ref 3.5–5.1)
Sodium: 142 mmol/L (ref 135–145)

## 2014-09-16 MED ORDER — ONDANSETRON HCL 4 MG PO TABS: 4.0000 mg | ORAL_TABLET | Freq: Four times a day (QID) | ORAL | Status: DC | PRN

## 2014-09-16 MED ORDER — TRAMADOL HCL 50 MG PO TABS: 50.0000 mg | ORAL_TABLET | Freq: Four times a day (QID) | ORAL | Status: DC | PRN

## 2014-09-16 MED ORDER — ACETAMINOPHEN 325 MG PO TABS: 650.0000 mg | ORAL_TABLET | Freq: Four times a day (QID) | ORAL | Status: DC | PRN

## 2014-09-16 MED ORDER — DOCUSATE SODIUM 100 MG PO CAPS: 100.0000 mg | ORAL_CAPSULE | Freq: Two times a day (BID) | ORAL | Status: DC

## 2014-09-16 MED ORDER — CYCLOBENZAPRINE HCL 5 MG PO TABS: 5.0000 mg | ORAL_TABLET | Freq: Three times a day (TID) | ORAL | Status: DC | PRN

## 2014-09-16 MED ORDER — POLYETHYLENE GLYCOL 3350 17 G PO PACK: 17.0000 g | PACK | Freq: Every day | ORAL | Status: AC | PRN

## 2014-09-16 MED ORDER — RIVAROXABAN 10 MG PO TABS: 10.0000 mg | ORAL_TABLET | Freq: Every day | ORAL | Status: DC

## 2014-09-16 MED ORDER — BISACODYL 10 MG RE SUPP: 10.0000 mg | Freq: Every day | RECTAL | Status: DC | PRN

## 2014-09-16 MED ORDER — OXYCODONE HCL 5 MG PO TABS: 5.0000 mg | ORAL_TABLET | ORAL | Status: DC | PRN

## 2014-09-16 NOTE — Progress Notes (Signed)
   Subjective: 2 Days Post-Op Procedure(s) (LRB): RIGHT TOTAL HIP ARTHROPLASTY ANTERIOR APPROACH (Right) Patient reports pain as mild.   Patient seen in rounds for Dr. Lequita Halt. Patient is well, and has had no acute complaints or problems Patient is ready to go to [facility]  Objective: Vital signs in last 24 hours: Temp:  [97.6 F (36.4 C)-97.9 F (36.6 C)] 97.6 F (36.4 C) (05/20 0506) Pulse Rate:  [61-67] 66 (05/20 0506) Resp:  [16-18] 18 (05/20 0506) BP: (145-158)/(49-58) 158/49 mmHg (05/20 0506) SpO2:  [95 %-97 %] 96 % (05/20 0506)  Intake/Output from previous day:  Intake/Output Summary (Last 24 hours) at 09/16/14 0919 Last data filed at 09/16/14 0916  Gross per 24 hour  Intake 1186.5 ml  Output   2651 ml  Net -1464.5 ml    Intake/Output this shift: Total I/O In: 240 [P.O.:240] Out: 801 [Urine:800; Stool:1]  Labs:  Recent Labs  09/15/14 0410 09/16/14 0415  HGB 11.3* 10.8*    Recent Labs  09/15/14 0410 09/16/14 0415  WBC 10.8* 15.1*  RBC 3.97* 3.72*  HCT 34.7* 32.8*  PLT 185 190    Recent Labs  09/15/14 0410 09/16/14 0415  NA 139 142  K 4.4 4.3  CL 104 107  CO2 28 28  BUN 16 20  CREATININE 1.15 1.03  GLUCOSE 195* 163*  CALCIUM 8.5* 8.5*   No results for input(s): LABPT, INR in the last 72 hours.  EXAM: General - Patient is Alert and Appropriate Extremity - Neurovascular intact Sensation intact distally Dorsiflexion/Plantar flexion intact Incision - clean, dry, no drainage Motor Function - intact, moving foot and toes well on exam.   Assessment/Plan: 2 Days Post-Op Procedure(s) (LRB): RIGHT TOTAL HIP ARTHROPLASTY ANTERIOR APPROACH (Right) Procedure(s) (LRB): RIGHT TOTAL HIP ARTHROPLASTY ANTERIOR APPROACH (Right) Past Medical History  Diagnosis Date  . Dyslipidemia     under control  . Medication intolerance     NIACIN  . CAD (coronary artery disease)     no ischemia nuclear scan 12/2005 EF60% /  nuclear, October, 2012, no  ischemia, EF normal  . Hiatal hernia   . Peptic ulcer disease     "just one little ulcer"  . Hypertension   . Hx of CABG     2002  . Ejection fraction     EF. 60%, Nuclear, 2007 /   EF, 60%, nuclear, October, 2012  . Carotid artery disease     Doppler, October, 2011, 40-59% bilateral  . Weight loss     August, 2012  . Myocardial infarction     1996  . Arthritis     knees and shoulders   . Hearing aid worn     bilateral ears   . Pneumonia     hx of x1  . GERD (gastroesophageal reflux disease)    Principal Problem:   OA (osteoarthritis) of hip  Estimated body mass index is 27.71 kg/(m^2) as calculated from the following:   Height as of this encounter: 6\' 1"  (1.854 m).   Weight as of this encounter: 95.255 kg (210 lb). Up with therapy Discharge to SNF Diet - Cardiac diet Follow up - in 5 days Activity - WBAT Disposition - Skilled nursing facility - [facility] Condition Upon Discharge - Good D/C Meds - See DC Summary DVT Prophylaxis - Xarelto  Avel Peace, PA-C Orthopaedic Surgery 09/16/2014, 9:19 AM

## 2014-09-16 NOTE — Discharge Summary (Signed)
Physician Discharge Summary   Patient ID: [name] MRN: [id number] DOB/AGE: [birth year] 81 y.o.  Admit date: 09/14/2014 Discharge date: 09-16-2014  Primary Diagnosis:  Osteoarthritis of the Right hip.   Admission Diagnoses:  Past Medical History  Diagnosis Date  . Dyslipidemia     under control  . Medication intolerance     NIACIN  . CAD (coronary artery disease)     no ischemia nuclear scan 12/2005 EF60% /  nuclear, October, 2012, no ischemia, EF normal  . Hiatal hernia   . Peptic ulcer disease     "just one little ulcer"  . Hypertension   . Hx of CABG     2002  . Ejection fraction     EF. 60%, Nuclear, 2007 /   EF, 60%, nuclear, October, 2012  . Carotid artery disease     Doppler, October, 2011, 40-59% bilateral  . Weight loss     August, 2012  . Myocardial infarction     1996  . Arthritis     knees and shoulders   . Hearing aid worn     bilateral ears   . Pneumonia     hx of x1  . GERD (gastroesophageal reflux disease)    Discharge Diagnoses:   Principal Problem:   OA (osteoarthritis) of hip  Estimated body mass index is 27.71 kg/(m^2) as calculated from the following:   Height as of this encounter: '6\' 1"'  (1.854 m).   Weight as of this encounter: 95.255 kg (210 lb).  Procedure(s) (LRB): RIGHT TOTAL HIP ARTHROPLASTY ANTERIOR APPROACH (Right)   Consults: None  HPI: Jerry [name] is a 81 y.o. male who has advanced end-  stage arthritis of his Right hip with progressively worsening pain and  dysfunction.The patient has failed nonoperative management and presents for  total hip arthroplasty.   Laboratory Data: Admission on 09/14/2014  Component Date Value Ref Range Status  . WBC 09/15/2014 10.8* 4.0 - 10.5 K/uL Final  . RBC 09/15/2014 3.97* 4.22 - 5.81 MIL/uL Final  . Hemoglobin 09/15/2014 11.3* 13.0 - 17.0 g/dL Final  . HCT 09/15/2014 34.7* 39.0 - 52.0 % Final  . MCV 09/15/2014 87.4  78.0 - 100.0 fL Final  . MCH 09/15/2014 28.5  26.0 - 34.0 pg  Final  . MCHC 09/15/2014 32.6  30.0 - 36.0 g/dL Final  . RDW 09/15/2014 13.9  11.5 - 15.5 % Final  . Platelets 09/15/2014 185  150 - 400 K/uL Final  . Sodium 09/15/2014 139  135 - 145 mmol/L Final  . Potassium 09/15/2014 4.4  3.5 - 5.1 mmol/L Final  . Chloride 09/15/2014 104  101 - 111 mmol/L Final  . CO2 09/15/2014 28  22 - 32 mmol/L Final  . Glucose, Bld 09/15/2014 195* 65 - 99 mg/dL Final  . BUN 09/15/2014 16  6 - 20 mg/dL Final  . Creatinine, Ser 09/15/2014 1.15  0.61 - 1.24 mg/dL Final  . Calcium 09/15/2014 8.5* 8.9 - 10.3 mg/dL Final  . GFR calc non Af Amer 09/15/2014 57* >60 mL/min Final  . GFR calc Af Amer 09/15/2014 >60  >60 mL/min Final   Comment: (NOTE) The eGFR has been calculated using the CKD EPI equation. This calculation has not been validated in all clinical situations. eGFR's persistently <60 mL/min signify possible Chronic Kidney Disease.   . Anion gap 09/15/2014 7  5 - 15 Final  . WBC 09/16/2014 15.1* 4.0 - 10.5 K/uL Final  . RBC 09/16/2014 3.72* 4.22 - 5.81 MIL/uL  Final  . Hemoglobin 09/16/2014 10.8* 13.0 - 17.0 g/dL Final  . HCT 09/16/2014 32.8* 39.0 - 52.0 % Final  . MCV 09/16/2014 88.2  78.0 - 100.0 fL Final  . MCH 09/16/2014 29.0  26.0 - 34.0 pg Final  . MCHC 09/16/2014 32.9  30.0 - 36.0 g/dL Final  . RDW 09/16/2014 14.2  11.5 - 15.5 % Final  . Platelets 09/16/2014 190  150 - 400 K/uL Final  . Sodium 09/16/2014 142  135 - 145 mmol/L Final  . Potassium 09/16/2014 4.3  3.5 - 5.1 mmol/L Final  . Chloride 09/16/2014 107  101 - 111 mmol/L Final  . CO2 09/16/2014 28  22 - 32 mmol/L Final  . Glucose, Bld 09/16/2014 163* 65 - 99 mg/dL Final  . BUN 09/16/2014 20  6 - 20 mg/dL Final  . Creatinine, Ser 09/16/2014 1.03  0.61 - 1.24 mg/dL Final  . Calcium 09/16/2014 8.5* 8.9 - 10.3 mg/dL Final  . GFR calc non Af Amer 09/16/2014 >60  >60 mL/min Final  . GFR calc Af Amer 09/16/2014 >60  >60 mL/min Final   Comment: (NOTE) The eGFR has been calculated using the CKD  EPI equation. This calculation has not been validated in all clinical situations. eGFR's persistently <60 mL/min signify possible Chronic Kidney Disease.   Georgiann Hahn gap 09/16/2014 7  5 - 15 Final  Hospital Outpatient Visit on 09/06/2014  Component Date Value Ref Range Status  . Color, Urine 09/06/2014 YELLOW  YELLOW Final  . APPearance 09/06/2014 CLEAR  CLEAR Final  . Specific Gravity, Urine 09/06/2014 1.019  1.005 - 1.030 Final  . pH 09/06/2014 6.5  5.0 - 8.0 Final  . Glucose, UA 09/06/2014 NEGATIVE  NEGATIVE mg/dL Final  . Hgb urine dipstick 09/06/2014 NEGATIVE  NEGATIVE Final  . Bilirubin Urine 09/06/2014 NEGATIVE  NEGATIVE Final  . Ketones, ur 09/06/2014 NEGATIVE  NEGATIVE mg/dL Final  . Protein, ur 09/06/2014 NEGATIVE  NEGATIVE mg/dL Final  . Urobilinogen, UA 09/06/2014 1.0  0.0 - 1.0 mg/dL Final  . Nitrite 09/06/2014 NEGATIVE  NEGATIVE Final  . Leukocytes, UA 09/06/2014 NEGATIVE  NEGATIVE Final   MICROSCOPIC NOT DONE ON URINES WITH NEGATIVE PROTEIN, BLOOD, LEUKOCYTES, NITRITE, OR GLUCOSE <1000 mg/dL.  Marland Kitchen MRSA, PCR 09/06/2014 NEGATIVE  NEGATIVE Final  . Staphylococcus aureus 09/06/2014 NEGATIVE  NEGATIVE Final   Comment:        The Xpert SA Assay (FDA approved for NASAL specimens in patients over 58 years of age), is one component of a comprehensive surveillance program.  Test performance has been validated by [facility] for patients greater than or equal to 58 year old. It is not intended to diagnose infection nor to guide or monitor treatment.   Marland Kitchen aPTT 09/06/2014 32  24 - 37 seconds Final  . WBC 09/06/2014 7.4  4.0 - 10.5 K/uL Final  . RBC 09/06/2014 4.52  4.22 - 5.81 MIL/uL Final  . Hemoglobin 09/06/2014 13.3  13.0 - 17.0 g/dL Final  . HCT 09/06/2014 39.9  39.0 - 52.0 % Final  . MCV 09/06/2014 88.3  78.0 - 100.0 fL Final  . MCH 09/06/2014 29.4  26.0 - 34.0 pg Final  . MCHC 09/06/2014 33.3  30.0 - 36.0 g/dL Final  . RDW 09/06/2014 13.9  11.5 - 15.5 % Final  .  Platelets 09/06/2014 238  150 - 400 K/uL Final  . Sodium 09/06/2014 139  135 - 145 mmol/L Final  . Potassium 09/06/2014 4.3  3.5 - 5.1 mmol/L Final  .  Chloride 09/06/2014 106  101 - 111 mmol/L Final  . CO2 09/06/2014 29  22 - 32 mmol/L Final  . Glucose, Bld 09/06/2014 101* 70 - 99 mg/dL Final  . BUN 09/06/2014 15  6 - 20 mg/dL Final  . Creatinine, Ser 09/06/2014 0.92  0.61 - 1.24 mg/dL Final  . Calcium 09/06/2014 9.3  8.9 - 10.3 mg/dL Final  . Total Protein 09/06/2014 7.7  6.5 - 8.1 g/dL Final  . Albumin 09/06/2014 3.8  3.5 - 5.0 g/dL Final  . AST 09/06/2014 23  15 - 41 U/L Final  . ALT 09/06/2014 15* 17 - 63 U/L Final  . Alkaline Phosphatase 09/06/2014 75  38 - 126 U/L Final  . Total Bilirubin 09/06/2014 0.5  0.3 - 1.2 mg/dL Final  . GFR calc non Af Amer 09/06/2014 >60  >60 mL/min Final  . GFR calc Af Amer 09/06/2014 >60  >60 mL/min Final   Comment: (NOTE) The eGFR has been calculated using the CKD EPI equation. This calculation has not been validated in all clinical situations. eGFR's persistently <60 mL/min signify possible Chronic Kidney Disease.   . Anion gap 09/06/2014 4* 5 - 15 Final  . Prothrombin Time 09/06/2014 12.9  11.6 - 15.2 seconds Final  . INR 09/06/2014 0.96  0.00 - 1.49 Final  . ABO/RH(D) 09/06/2014 O POS   Final  . Antibody Screen 09/06/2014 NEG   Final  . Sample Expiration 09/06/2014 09/17/2014   Final     X-Rays:Dg Pelvis Portable  09/14/2014   CLINICAL DATA:  Status post right hip arthroplasty.  EXAM: PORTABLE PELVIS 1-2 VIEWS  COMPARISON:  None.  FINDINGS: Right hip arthroplasty is well-seated and well aligned on this single AP view.  There is no acute fracture or evidence of an operative complication.  IMPRESSION: Well-aligned right hip prosthesis.   Electronically Signed   By: Lajean Manes M.D.   On: 09/14/2014 13:33   Dg C-arm 1-60 Min-no Report  09/14/2014   CLINICAL DATA: Righ total hip replacement   C-ARM 1-60 MINUTES  Fluoroscopy was utilized by the  requesting physician.  No radiographic  interpretation.     EKG: Orders placed or performed in visit on 07/15/14  . EKG 12-Lead     Hospital Course: Patient was admitted to [facility] and taken to the OR and underwent the above state procedure without complications.  Patient tolerated the procedure well and was later transferred to the recovery room and then to the orthopaedic floor for postoperative care.  They were given PO and IV analgesics for pain control following their surgery.  They were given 24 hours of postoperative antibiotics of  Anti-infectives    Start     Dose/Rate Route Frequency Ordered Stop   09/14/14 1700  ceFAZolin (ANCEF) IVPB 2 g/50 mL premix     2 g 100 mL/hr over 30 Minutes Intravenous Every 6 hours 09/14/14 1411 09/14/14 2320   09/14/14 0816  ceFAZolin (ANCEF) IVPB 2 g/50 mL premix     2 g 100 mL/hr over 30 Minutes Intravenous On call to O.R. 09/14/14 5956 09/14/14 1106     and started on DVT prophylaxis in the form of Xarelto.   PT and OT were ordered for total hip protocol.  The patient was allowed to be WBAT with therapy. Discharge planning was consulted to help with postop disposition and equipment needs. Social worker consulted to assist with placement of the patient.  Patient had a good night on the evening of  surgery.  They started to get up OOB with therapy on day one.  Hemovac drain was pulled without difficulty.  Continued to work with therapy into day two.  Dressing was changed on day two and the incision was healing well.  Patient was seen in rounds and was ready to go home.  Diet: Cardiac diet Activity:WBAT Follow-up:in 5 days on Tuesday 09/20/2014 Disposition - [facility] Discharged Condition: good   Discharge Instructions    Call MD / Call 911    Complete by:  As directed   If you experience chest pain or shortness of breath, CALL 911 and be transported to the hospital emergency room.  If you develope a fever  above 101 F, pus (white drainage) or increased drainage or redness at the wound, or calf pain, call your surgeon's office.     Change dressing    Complete by:  As directed   You may change your dressing dressing daily with sterile 4 x 4 inch gauze dressing and paper tape.  Do not submerge the incision under water.     Constipation Prevention    Complete by:  As directed   Drink plenty of fluids.  Prune juice may be helpful.  You may use a stool softener, such as Colace (over the counter) 100 mg twice a day.  Use MiraLax (over the counter) for constipation as needed.     Diet - low sodium heart healthy    Complete by:  As directed      Discharge instructions    Complete by:  As directed   Pick up stool softner and laxative for home use following surgery while on pain medications. Do not submerge incision under water. Please use good hand washing techniques while changing dressing each day. May shower starting three days after surgery. Please use a clean towel to pat the incision dry following showers. Continue to use ice for pain and swelling after surgery. Do not use any lotions or creams on the incision until instructed by your surgeon.  Total Hip Protocol.  Take Xarelto for two and a half more weeks, then discontinue Xarelto. Once the patient has completed the Xarelto, they may resume the 81 mg Aspirin.  Postoperative Constipation Protocol  Constipation - defined medically as fewer than three stools per week and severe constipation as less than one stool per week.  One of the most common issues patients have following surgery is constipation.  Even if you have a regular bowel pattern at home, your normal regimen is likely to be disrupted due to multiple reasons following surgery.  Combination of anesthesia, postoperative narcotics, change in appetite and fluid intake all can affect your bowels.  In order to avoid complications following surgery, here are some recommendations in order to  help you during your recovery period.  Colace (docusate) - Pick up an over-the-counter form of Colace or another stool softener and take twice a day as long as you are requiring postoperative pain medications.  Take with a full glass of water daily.  If you experience loose stools or diarrhea, hold the colace until you stool forms back up.  If your symptoms do not get better within 1 week or if they get worse, check with your doctor.  Dulcolax (bisacodyl) - Pick up over-the-counter and take as directed by the product packaging as needed to assist with the movement of your bowels.  Take with a full glass of water.  Use this product as needed  if not relieved by Colace only.   MiraLax (polyethylene glycol) - Pick up over-the-counter to have on hand.  MiraLax is a solution that will increase the amount of water in your bowels to assist with bowel movements.  Take as directed and can mix with a glass of water, juice, soda, coffee, or tea.  Take if you go more than two days without a movement. Do not use MiraLax more than once per day. Call your doctor if you are still constipated or irregular after using this medication for 7 days in a row.  If you continue to have problems with postoperative constipation, please contact the office for further assistance and recommendations.  If you experience "the worst abdominal pain ever" or develop nausea or vomiting, please contact the office immediatly for further recommendations for treatment.  When discharged from the skilled rehab facility, please have the facility set up the patient's [place] prior to being released.  Please make sure this gets set up prior to release in order to avoid any lapse of therapy following the rehab stay.  Also provide the patient with their medications at time of release from the facility to include their pain medication, the muscle relaxants, and their blood thinner medication.  If the patient is still at the rehab  facility at time of follow up appointment, please also assist the patient in arranging follow up appointment in our office and any transportation needs. ICE to the affected knee or hip every three hours for 30 minutes at a time and then as needed for pain and swelling.     Do not sit on low chairs, stoools or toilet seats, as it may be difficult to get up from low surfaces    Complete by:  As directed      Driving restrictions    Complete by:  As directed   No driving until released by the physician.     Increase activity slowly as tolerated    Complete by:  As directed      Lifting restrictions    Complete by:  As directed   No lifting until released by the physician.     Patient may shower    Complete by:  As directed   You may shower without a dressing once there is no drainage.  Do not wash over the wound.  If drainage remains, do not shower until drainage stops.     TED hose    Complete by:  As directed   Use stockings (TED hose) for 3 weeks on both leg(s).  You may remove them at night for sleeping.     Weight bearing as tolerated    Complete by:  As directed   Laterality:  right  Extremity:  Lower            Medication List    STOP taking these medications        aspirin 81 MG tablet     multivitamin with minerals Tabs tablet      TAKE these medications        acetaminophen 325 MG tablet  Commonly known as:  TYLENOL  Take 2 tablets (650 mg total) by mouth every 6 (six) hours as needed for mild pain (or Fever >/= 101).     amLODipine 10 MG tablet  Commonly known as:  NORVASC  TAKE ONE TABLET BY MOUTH ONCE DAILY.     bisacodyl 10 MG suppository  Commonly known as:  DULCOLAX  Place 1 suppository (10 mg total) rectally daily as needed for moderate constipation.     cyclobenzaprine 5 MG tablet  Commonly known as:  FLEXERIL  Take 1 tablet (5 mg total) by mouth 3 (three) times daily as needed for muscle spasms.     docusate sodium 100 MG capsule  Commonly known  as:  COLACE  Take 1 capsule (100 mg total) by mouth 2 (two) times daily.     gabapentin 600 MG tablet  Commonly known as:  NEURONTIN  Take 2 tablets (1,200 mg total) by mouth 3 (three) times daily.     metoprolol 50 MG tablet  Commonly known as:  LOPRESSOR  TAKE ONE-HALF TABLET BY MOUTH THREE TIMES DAILY     ondansetron 4 MG tablet  Commonly known as:  ZOFRAN  Take 1 tablet (4 mg total) by mouth every 6 (six) hours as needed for nausea.     oxyCODONE 5 MG immediate release tablet  Commonly known as:  Oxy IR/ROXICODONE  Take 1-2 tablets (5-10 mg total) by mouth every 3 (three) hours as needed for moderate pain, severe pain or breakthrough pain.     polyethylene glycol packet  Commonly known as:  MIRALAX / GLYCOLAX  Take 17 g by mouth daily as needed for mild constipation.     ranitidine 300 MG capsule  Commonly known as:  ZANTAC  Take 300 mg by mouth daily.     rivaroxaban 10 MG Tabs tablet  Commonly known as:  XARELTO  - Take 1 tablet (10 mg total) by mouth daily with breakfast. Take Xarelto for two and a half more weeks, then discontinue Xarelto.  - Once the patient has completed the Xarelto, they may resume the 81 mg Aspirin.     simvastatin 20 MG tablet  Commonly known as:  ZOCOR  TAKE ONE TABLET BY MOUTH AT BEDTIME     traMADol 50 MG tablet  Commonly known as:  ULTRAM  Take 1-2 tablets (50-100 mg total) by mouth every 6 (six) hours as needed (MILD PAIN).           Follow-up Information    Follow up with Gearlean Alf, MD. Schedule an appointment as soon as possible for a visit on 09/20/2014.   Specialty:  Orthopedic Surgery   Why:  Call office ASAP at [phone] to setup appointment next Tuesday 09/20/2014 with Dr. Wynelle Link.   Contact information:   [address] [place] [address] [phone]       Signed: Arlee Muslim, PA-C Orthopaedic Surgery 09/16/2014, 9:27 AM

## 2014-09-16 NOTE — Progress Notes (Signed)
09/16/14 Nursing 0230 Patient woke up confused and walked into 1608 to use the bathroom. Patient reoriented upon staff arrival to bathroom. Pt escorted back to his room. Bed alarm initiated. Patient in 1608 comforted. Pt very undrstanding of the situation. Housekeeping called to clean room.

## 2014-09-16 NOTE — Clinical Social Work Placement (Signed)
   CLINICAL SOCIAL WORK PLACEMENT  NOTE  Date:  09/16/2014  Patient Details  Name: Jerry [name] MRN: [id number] Date of Birth: 19-Dec-1929  Clinical Social Work is seeking post-discharge placement for this patient at the Skilled  Nursing Facility level of care (*CSW will initial, date and re-position this form in  chart as items are completed):  No   Patient/family provided with [facility] Clinical Social Work Department's list of facilities offering this level of care within the geographic area requested by the patient (or if unable, by the patient's family).  Yes   Patient/family informed of their freedom to choose among providers that offer the needed level of care, that participate in Medicare, Medicaid or managed care program needed by the patient, have an available bed and are willing to accept the patient.      Patient/family informed of [facility]'s ownership interest in [facility] and [facility], as well as of the fact that they are under no obligation to receive care at these facilities.  PASRR submitted to EDS on       PASRR number received on       Existing PASRR number confirmed on 09/14/14     FL2 transmitted to all facilities in geographic area requested by pt/family on 09/14/14     FL2 transmitted to all facilities within larger geographic area on       Patient informed that his/her managed care company has contracts with or will negotiate with certain facilities, including the following:            Patient/family informed of bed offers received.  Patient chooses bed at [facility]     Physician recommends and patient chooses bed at      Patient to be transferred to [facility] on 09/16/14.  Patient to be transferred to facility by CAR     Patient family notified on 09/16/14 of transfer.  Name of family member notified:  Daughter     PHYSICIAN       Additional Comment: Pt / family are in agreement with d/c to SNF today. PT approved transport  by car. NSG reviewed d/c summary, scripts, avs. Scripts included in d/c packet. D/C packet provided to pt prior to d/c.    _______________________________________________ Royetta Asal, LCSW 09/16/2014, 4:17 PM

## 2014-09-16 NOTE — Progress Notes (Addendum)
Patient is alert and oriented. No PRN pain medication required during shift. Patient is to be discharged to [facility]. Reviewed discharge information and sent patient with packet to facility. No s/s of acute distress. Waiting to give report to [facility] nurse. Then patient is to leave with family. Reported provided to [place] at [facility].

## 2014-09-16 NOTE — Progress Notes (Signed)
Physical Therapy Treatment Patient Details Name: Jerry [name] MRN: [id number] DOB: [birth year] Today's Date: 2014/09/27    History of Present Illness R THR    PT Comments    Pt very motivated and progressing well with mobility.  Plans dc SNF this date.  Follow Up Recommendations  SNF     Equipment Recommendations  None recommended by PT    Recommendations for Other Services OT consult     Precautions / Restrictions Precautions Precautions: Fall Restrictions Weight Bearing Restrictions: No Other Position/Activity Restrictions: WBAT    Mobility  Bed Mobility               General bed mobility comments: OOB with nursing  Transfers Overall transfer level: Needs assistance Equipment used: Rolling walker (2 wheeled) Transfers: Sit to/from Stand Sit to Stand: Min guard         General transfer comment: cues for LE management and use of UEs to self assist  Ambulation/Gait Ambulation/Gait assistance: Min guard Ambulation Distance (Feet): 200 Feet (twice) Assistive device: Rolling walker (2 wheeled) Gait Pattern/deviations: Step-to pattern;Step-through pattern;Shuffle;Trunk flexed     General Gait Details: cues for sequence, posture, ER on R and position from [employer] Mobility    Modified Rankin (Stroke Patients Only)       Balance                                    Cognition Arousal/Alertness: Awake/alert Behavior During Therapy: WFL for tasks assessed/performed Overall Cognitive Status: Within Functional Limits for tasks assessed                      Exercises Total Joint Exercises Ankle Circles/Pumps: AROM;Both;15 reps;Supine Quad Sets: AROM;Both;10 reps;Supine Gluteal Sets: AROM;Both;10 reps;Supine Heel Slides: AAROM;Right;Supine;20 reps Hip ABduction/ADduction: AAROM;Right;Supine;15 reps Long Arc Quad: AROM;Both;10 reps;Seated    General Comments        Pertinent Vitals/Pain  Pain Assessment: 0-10 Pain Score: 2  Pain Location: R hip./thigh Pain Descriptors / Indicators: Aching;Sore Pain Intervention(s): Limited activity within patient's tolerance;Monitored during session    Home Living                      Prior Function            PT Goals (current goals can now be found in the care plan section) Acute Rehab PT Goals Patient Stated Goal: Rehab and then home alone to resume previous lifestyle with decreased pain PT Goal Formulation: With patient Time For Goal Achievement: 09/20/14 Potential to Achieve Goals: Good Progress towards PT goals: Progressing toward goals    Frequency  7X/week    PT Plan Current plan remains appropriate    Co-evaluation             End of Session Equipment Utilized During Treatment: Gait belt Activity Tolerance: Patient tolerated treatment well Patient left: in chair;with call bell/phone within reach;with family/visitor present     Time: 9242-6834 PT Time Calculation (min) (ACUTE ONLY): 23 min  Charges:  $Gait Training: 8-22 mins $Therapeutic Exercise: 8-22 mins                    G Codes:      Kandyce Dieguez 09-27-2014, 9:57 AM

## 2014-09-19 ENCOUNTER — Encounter: Payer: Self-pay | Admitting: Adult Health

## 2014-09-19 ENCOUNTER — Non-Acute Institutional Stay (SKILLED_NURSING_FACILITY): Payer: Medicare Other | Admitting: Adult Health

## 2014-09-19 DIAGNOSIS — D62 Acute posthemorrhagic anemia: Secondary | ICD-10-CM | POA: Diagnosis not present

## 2014-09-19 DIAGNOSIS — G629 Polyneuropathy, unspecified: Secondary | ICD-10-CM | POA: Diagnosis not present

## 2014-09-19 DIAGNOSIS — M1611 Unilateral primary osteoarthritis, right hip: Secondary | ICD-10-CM | POA: Diagnosis not present

## 2014-09-19 DIAGNOSIS — I10 Essential (primary) hypertension: Secondary | ICD-10-CM

## 2014-09-19 DIAGNOSIS — K5900 Constipation, unspecified: Secondary | ICD-10-CM | POA: Diagnosis not present

## 2014-09-19 DIAGNOSIS — E785 Hyperlipidemia, unspecified: Secondary | ICD-10-CM

## 2014-09-19 DIAGNOSIS — K219 Gastro-esophageal reflux disease without esophagitis: Secondary | ICD-10-CM

## 2014-09-20 ENCOUNTER — Non-Acute Institutional Stay (SKILLED_NURSING_FACILITY): Payer: Medicare Other | Admitting: Internal Medicine

## 2014-09-20 DIAGNOSIS — M1611 Unilateral primary osteoarthritis, right hip: Secondary | ICD-10-CM

## 2014-09-20 DIAGNOSIS — D72829 Elevated white blood cell count, unspecified: Secondary | ICD-10-CM | POA: Diagnosis not present

## 2014-09-20 DIAGNOSIS — D62 Acute posthemorrhagic anemia: Secondary | ICD-10-CM

## 2014-09-20 DIAGNOSIS — K5901 Slow transit constipation: Secondary | ICD-10-CM | POA: Diagnosis not present

## 2014-09-20 DIAGNOSIS — I10 Essential (primary) hypertension: Secondary | ICD-10-CM | POA: Diagnosis not present

## 2014-09-20 DIAGNOSIS — E785 Hyperlipidemia, unspecified: Secondary | ICD-10-CM | POA: Diagnosis not present

## 2014-09-20 DIAGNOSIS — K279 Peptic ulcer, site unspecified, unspecified as acute or chronic, without hemorrhage or perforation: Secondary | ICD-10-CM

## 2014-09-20 DIAGNOSIS — G609 Hereditary and idiopathic neuropathy, unspecified: Secondary | ICD-10-CM

## 2014-09-20 NOTE — Progress Notes (Signed)
Patient ID: Jerry [name], male   DOB: [birth year], 81 y.o.   MRN: [id number]   09/19/14  Facility:  Nursing Home Location:  [facility] Room Number: 706-P LEVEL OF CARE:  SNF (31)   Chief Complaint  Patient presents with  . Hospitalization Follow-up    Osteoarthritis S/P right total hip arthroplasty, hypertension, neuropathy, GERD, hyperlipidemia, constipation and anemia    HISTORY OF PRESENT ILLNESS:  This is an 81 year old male who has been admitted to [facility] on 09/16/14 from [facility] with osteoarthritis S/P right total hip arthroplasty on 09/14/14. He has PMH of dyslipidemia, CAD, hiatal hernia, PUD, hypertension, CABG 2002 and GERD.  He has been admitted for a short-term rehabilitation.  PAST MEDICAL HISTORY:  Past Medical History  Diagnosis Date  . Dyslipidemia     under control  . Medication intolerance     NIACIN  . CAD (coronary artery disease)     no ischemia nuclear scan 12/2005 EF60% /  nuclear, October, 2012, no ischemia, EF normal  . Hiatal hernia   . Peptic ulcer disease     "just one little ulcer"  . Hypertension   . Hx of CABG     2002  . Ejection fraction     EF. 60%, Nuclear, 2007 /   EF, 60%, nuclear, October, 2012  . Carotid artery disease     Doppler, October, 2011, 40-59% bilateral  . Weight loss     August, 2012  . Myocardial infarction     1996  . Arthritis     knees and shoulders   . Hearing aid worn     bilateral ears   . Pneumonia     hx of x1  . GERD (gastroesophageal reflux disease)     CURRENT MEDICATIONS: Reviewed per MAR/see medication list  No Known Allergies   REVIEW OF SYSTEMS:  GENERAL: no change in appetite, no fatigue, no weight changes, no fever, chills or weakness RESPIRATORY: no cough, SOB, DOE, wheezing, hemoptysis CARDIAC: no chest pain, edema or palpitations GI: no abdominal pain, diarrhea, constipation, heart burn, nausea or vomiting  PHYSICAL EXAMINATION  GENERAL:  no acute distress, normal body habitus SKIN:  Left hip surgical site has steri-strips and dry dressing, no redness EYES: conjunctivae normal, sclerae normal, normal eye lids NECK: supple, trachea midline, no neck masses, no thyroid tenderness, no thyromegaly LYMPHATICS: no LAN in the neck, no supraclavicular LAN RESPIRATORY: breathing is even & unlabored, BS CTAB CARDIAC: RRR, no murmur,no extra heart sounds, no edema GI: abdomen soft, normal BS, no masses, no tenderness, no hepatomegaly, no splenomegaly EXTREMITIES:  Able to move 4 extremities PSYCHIATRIC: the patient is alert & oriented to person, affect & behavior appropriate  LABS/RADIOLOGY: Labs reviewed: Basic Metabolic Panel:  Recent Labs  40/98/11 1040 09/15/14 0410 09/16/14 0415  NA 139 139 142  K 4.3 4.4 4.3  CL 106 104 107  CO2 GLUCOSE 101* 195* 163*  BUN CREATININE 0.92 1.15 1.03  CALCIUM 9.3 8.5* 8.5*   Liver Function Tests:  Recent Labs  09/06/14 1040  AST 23  ALT 15*  ALKPHOS 75  BILITOT 0.5  PROT 7.7  ALBUMIN 3.8   CBC:  Recent Labs  09/06/14 1040 09/15/14 0410 09/16/14 0415  WBC 7.4 10.8* 15.1*  HGB 13.3 11.3* 10.8*  HCT 39.9 34.7* 32.8*  MCV 88.3 87.4 88.2  PLT 238 185 190     Dg  Pelvis Portable  09/14/2014   CLINICAL DATA:  Status post right hip arthroplasty.  EXAM: PORTABLE PELVIS 1-2 VIEWS  COMPARISON:  None.  FINDINGS: Right hip arthroplasty is well-seated and well aligned on this single AP view.  There is no acute fracture or evidence of an operative complication.  IMPRESSION: Well-aligned right hip prosthesis.   Electronically Signed   By: Amie Portland M.D.   On: 09/14/2014 13:33   Dg C-arm 1-60 Min-no Report  09/14/2014   CLINICAL DATA: Righ total hip replacement   C-ARM 1-60 MINUTES  Fluoroscopy was utilized by the requesting physician.  No radiographic  interpretation.     ASSESSMENT/PLAN:  Osteoarthritis S/P right total hip arthroplasty - for  rehabilitation; continue oxycodone 5 mg 1-2 tabs by mouth every 3 hours when necessary and tramadol 50 mg 1-2 tabs by mouth every 6 hours when necessary for pain; so alto 10 mg 1 tab by mouth daily 2 1/2 weeks then aspirin 81 mg 1 tab by mouth daily for DVT prophylaxis; and follow-up with Dr. Lequita Halt, orthopedic surgeon, on 09/29/14 Hypertension - continue amlodipine 10 mg 1 tab by mouth daily Constipation - continue Colace 100 mg 1 capsule by mouth twice a day and MiraLAX 17 g by mouth daily when necessary Neuropathy - continue Neurontin 600 mg 2 tabs = 1200 mg by mouth 3 times a day GERD - continue Zantac 300 mg 1 capsule by mouth daily Hyperlipidemia - continue Zocor 20 mg 1 tab by mouth daily at bedtime Anemia, post op blood loss - hemoglobin 10.8   Goals of care:  Short-term rehabilitation  Spent 50 minutes in patient care.     [facility], NP [employer] [phone]

## 2014-09-20 NOTE — Progress Notes (Signed)
Patient ID: Jerry [name], male   DOB: 1929-11-27, 81 y.o.   MRN: [id number]     [facility]   PCP: BURNETT,BRENT A, MD  Code Status: full code  No Known Allergies  Chief Complaint  Patient presents with  . New Admit To SNF     HPI:  81 year old patient is here for short term rehabilitation post hospital admission from 09/14/14-09/16/14 with right hip OA. He underwent right hip arthroplasty. He is seen in his room today. He denies any concerns. He has been working with therapy team He has PMH of HTN, CAD, OA.  Review of Systems:  Constitutional: Negative for fever, chills, diaphoresis.  HENT: Negative for headache, congestion, nasal discharge Eyes: Negative for eye pain, blurred vision, double vision and discharge.  Respiratory: Negative for cough, shortness of breath and wheezing.   Cardiovascular: Negative for chest pain, palpitations, leg swelling.  Gastrointestinal: Negative for heartburn, nausea, vomiting, abdominal pain. Appetite is good. Had bowel movement this am Genitourinary: Negative for dysuria Musculoskeletal: Negative for back pain, falls Skin: Negative for itching, rash.  Neurological: Negative for dizziness, tingling, focal weakness Psychiatric/Behavioral: Negative for depression  Past Medical History  Diagnosis Date  . Dyslipidemia     under control  . Medication intolerance     NIACIN  . CAD (coronary artery disease)     no ischemia nuclear scan 12/2005 EF60% /  nuclear, October, 2012, no ischemia, EF normal  . Hiatal hernia   . Peptic ulcer disease     "just one little ulcer"  . Hypertension   . Hx of CABG     2002  . Ejection fraction     EF. 60%, Nuclear, 2007 /   EF, 60%, nuclear, October, 2012  . Carotid artery disease     Doppler, October, 2011, 40-59% bilateral  . Weight loss     August, 2012  . Myocardial infarction     1996  . Arthritis     knees and shoulders   . Hearing aid worn     bilateral ears     . Pneumonia     hx of x1  . GERD (gastroesophageal reflux disease)    Past Surgical History  Procedure Laterality Date  . Cardiac catheterization    . Total knee arthroplasty  05/13/2011    Procedure: TOTAL KNEE ARTHROPLASTY;  Surgeon: Loanne Drilling, MD;  Location: [facility];  Service: Orthopedics;  Laterality: Right;  . Coronary artery bypass graft      5  . Joint replacement Bilateral 2011, 2013    knees  . Hand surgery Right ~1998  . Total hip arthroplasty Right 09/14/2014    Procedure: RIGHT TOTAL HIP ARTHROPLASTY ANTERIOR APPROACH;  Surgeon: Ollen Gross, MD;  Location: [facility];  Service: Orthopedics;  Laterality: Right;   Social History:   reports that he quit smoking about 20 years ago. His smoking use included Cigarettes and Cigars. He has never used smokeless tobacco. He reports that he does not drink alcohol or use illicit drugs.  Family History  Problem Relation Age of Onset  . Stroke Mother   . Stroke Father     Medications: Patient's Medications  New Prescriptions   No medications on file  Previous Medications   ACETAMINOPHEN (TYLENOL) 325 MG TABLET    Take 2 tablets (650 mg total) by mouth every 6 (six) hours as needed for mild pain (or Fever >/= 101).   AMLODIPINE (NORVASC) 10 MG  TABLET    TAKE ONE TABLET BY MOUTH ONCE DAILY.   BISACODYL (DULCOLAX) 10 MG SUPPOSITORY    Place 1 suppository (10 mg total) rectally daily as needed for moderate constipation.   CYCLOBENZAPRINE (FLEXERIL) 5 MG TABLET    Take 1 tablet (5 mg total) by mouth 3 (three) times daily as needed for muscle spasms.   DOCUSATE SODIUM (COLACE) 100 MG CAPSULE    Take 1 capsule (100 mg total) by mouth 2 (two) times daily.   GABAPENTIN (NEURONTIN) 600 MG TABLET    Take 2 tablets (1,200 mg total) by mouth 3 (three) times daily.   METOPROLOL (LOPRESSOR) 50 MG TABLET    TAKE ONE-HALF TABLET BY MOUTH THREE TIMES DAILY   ONDANSETRON (ZOFRAN) 4 MG TABLET    Take 1 tablet (4 mg total) by mouth every 6 (six)  hours as needed for nausea.   OXYCODONE (OXY IR/ROXICODONE) 5 MG IMMEDIATE RELEASE TABLET    Take 1-2 tablets (5-10 mg total) by mouth every 3 (three) hours as needed for moderate pain, severe pain or breakthrough pain.   POLYETHYLENE GLYCOL (MIRALAX / GLYCOLAX) PACKET    Take 17 g by mouth daily as needed for mild constipation.   RANITIDINE (ZANTAC) 300 MG CAPSULE    Take 300 mg by mouth daily.    RIVAROXABAN (XARELTO) 10 MG TABS TABLET    Take 1 tablet (10 mg total) by mouth daily with breakfast. Take Xarelto for two and a half more weeks, then discontinue Xarelto. Once the patient has completed the Xarelto, they may resume the 81 mg Aspirin.   SIMVASTATIN (ZOCOR) 20 MG TABLET    TAKE ONE TABLET BY MOUTH AT BEDTIME   TRAMADOL (ULTRAM) 50 MG TABLET    Take 1-2 tablets (50-100 mg total) by mouth every 6 (six) hours as needed (MILD PAIN).  Modified Medications   No medications on file  Discontinued Medications   No medications on file     Physical Exam: Filed Vitals:   09/20/14 0812  BP: 135/62  Pulse: 81  Temp: 97 F (36.1 C)  Resp: 17  SpO2: 94%    General- elderly male, well built, in no acute distress Head- normocephalic, atraumatic Throat- moist mucus membrane Neck- no cervical lymphadenopathy Cardiovascular- normal s1,s2, no murmurs, palpable dorsalis pedis and radial pulses, trace right leg edema Respiratory- bilateral clear to auscultation, no wheeze, no rhonchi, no crackles, no use of accessory muscles Abdomen- bowel sounds present, soft, non tender Musculoskeletal- able to move all 4 extremities, right leg range of motion limited at the hip Neurological- no focal deficit Skin- warm and dry, right hip dressing clean and has steri strips on the surgical incision, healing well Psychiatry- alert and oriented to person, place and time, normal mood and affect    Labs reviewed: Basic Metabolic Panel:  Recent Labs  03/14/51 1040 09/15/14 0410 09/16/14 0415  NA 139 139  142  K 4.3 4.4 4.3  CL 106 104 107  CO2 29 28 28   GLUCOSE 101* 195* 163*  BUN 15 16 20   CREATININE 0.92 1.15 1.03  CALCIUM 9.3 8.5* 8.5*   Liver Function Tests:  Recent Labs  09/06/14 1040  AST 23  ALT 15*  ALKPHOS 75  BILITOT 0.5  PROT 7.7  ALBUMIN 3.8   No results for input(s): LIPASE, AMYLASE in the last 8760 hours. No results for input(s): AMMONIA in the last 8760 hours. CBC:  Recent Labs  09/06/14 1040 09/15/14 0410 09/16/14 0415  WBC  7.4 10.8* 15.1*  HGB 13.3 11.3* 10.8*  HCT 39.9 34.7* 32.8*  MCV 88.3 87.4 88.2  PLT 238 185 190    Assessment/Plan  Right hip OA S/p right hip arthroplasty. Has f/u with Dr frank aluisio. Will have him work with physical therapy and occupational therapy team to help with gait training and muscle strengthening exercises.fall precautions. Skin care. Encourage to be out of bed. Ted hose for both legs. Continue tylenol 650 mg q6h prn mild pain and oxycodone IR 5 mg 1-2 tab q3h prn for moderate to severe pain. Continue flexeril 5 mg tid prn muscle spasm. Continue xarelto for dvt prophylaxis for two and a half week and then start baby aspirin  Constipation Stable, continue colace 100 mg bid,miralax daily prn with prn dulcolax suppository  Acute blood loss anemia Post op, monitor h&h, last hb 10.8  Leukocytosis No signs of infection at incision site, alert and oriented, afebrile. Possible stress response. monitor cbc with diff  HTN Stable reading, continue norvasc 10 mg daily, metoprolol 25 mg itd  Neuropathy Stable, continue home regimen gabapentin 1200 mg tid  PUD Stable, continue zantac 300 mg daily  HLD Continue zocor 20 mg daily   Goals of care: short term rehabilitation   Labs/tests ordered: cbc with diff  Family/ staff Communication: reviewed care plan with patient and nursing supervisor    Oneal Grout, MD  [facility] [phone] (Monday-Friday 8 am - 5 pm) [phone] (afterhours)

## 2014-09-22 ENCOUNTER — Encounter: Payer: Self-pay | Admitting: Adult Health

## 2014-09-22 ENCOUNTER — Non-Acute Institutional Stay (SKILLED_NURSING_FACILITY): Payer: Medicare Other | Admitting: Adult Health

## 2014-09-22 DIAGNOSIS — D62 Acute posthemorrhagic anemia: Secondary | ICD-10-CM

## 2014-09-22 DIAGNOSIS — E785 Hyperlipidemia, unspecified: Secondary | ICD-10-CM | POA: Diagnosis not present

## 2014-09-22 DIAGNOSIS — I10 Essential (primary) hypertension: Secondary | ICD-10-CM | POA: Diagnosis not present

## 2014-09-22 DIAGNOSIS — K219 Gastro-esophageal reflux disease without esophagitis: Secondary | ICD-10-CM

## 2014-09-22 DIAGNOSIS — M1611 Unilateral primary osteoarthritis, right hip: Secondary | ICD-10-CM

## 2014-09-22 DIAGNOSIS — G629 Polyneuropathy, unspecified: Secondary | ICD-10-CM

## 2014-09-22 DIAGNOSIS — K5900 Constipation, unspecified: Secondary | ICD-10-CM

## 2014-09-22 NOTE — Progress Notes (Signed)
Patient ID: Jerry [name], male   DOB: 07-28-1929, 81 y.o.   MRN: [id number]   09/22/14  Facility:  Nursing Home Location:  [facility] Room Number: 706-P LEVEL OF CARE:  SNF (31)   Chief Complaint  Patient presents with  . Discharge Note    Osteoarthritis S/P right total hip arthroplasty, hypertension, neuropathy, GERD, hyperlipidemia, constipation and anemia    HISTORY OF PRESENT ILLNESS:  This is an 81 year old male who is for discharge hoe with Home health PT for endurance. He has been admitted to [facility] on 09/16/14 from [facility] with osteoarthritis S/P right total hip arthroplasty on 09/14/14. He has PMH of dyslipidemia, CAD, hiatal hernia, PUD, hypertension, CABG 2002 and GERD.  Patient was admitted to this facility for short-term rehabilitation after the patient's recent hospitalization.  Patient has completed SNF rehabilitation and therapy has cleared the patient for discharge.  PAST MEDICAL HISTORY:  Past Medical History  Diagnosis Date  . Dyslipidemia     under control  . Medication intolerance     NIACIN  . CAD (coronary artery disease)     no ischemia nuclear scan 12/2005 EF60% /  nuclear, October, 2012, no ischemia, EF normal  . Hiatal hernia   . Peptic ulcer disease     "just one little ulcer"  . Hypertension   . Hx of CABG     2002  . Ejection fraction     EF. 60%, Nuclear, 2007 /   EF, 60%, nuclear, October, 2012  . Carotid artery disease     Doppler, October, 2011, 40-59% bilateral  . Weight loss     August, 2012  . Myocardial infarction     1996  . Arthritis     knees and shoulders   . Hearing aid worn     bilateral ears   . Pneumonia     hx of x1  . GERD (gastroesophageal reflux disease)     CURRENT MEDICATIONS: Reviewed per MAR/see medication list  No Known Allergies   REVIEW OF SYSTEMS:  GENERAL: no change in appetite, no fatigue, no weight changes, no fever, chills or weakness RESPIRATORY: no  cough, SOB, DOE, wheezing, hemoptysis CARDIAC: no chest pain, edema or palpitations GI: no abdominal pain, diarrhea, constipation, heart burn, nausea or vomiting  PHYSICAL EXAMINATION  GENERAL: no acute distress, normal body habitus SKIN:  Left hip surgical site is dry, no redness NECK: supple, trachea midline, no neck masses, no thyroid tenderness, no thyromegaly LYMPHATICS: no LAN in the neck, no supraclavicular LAN RESPIRATORY: breathing is even & unlabored, BS CTAB CARDIAC: RRR, no murmur,no extra heart sounds, no edema GI: abdomen soft, normal BS, no masses, no tenderness, no hepatomegaly, no splenomegaly EXTREMITIES:  Able to move 4 extremities PSYCHIATRIC: the patient is alert & oriented to person, affect & behavior appropriate  LABS/RADIOLOGY: Labs reviewed: Basic Metabolic Panel:  Recent Labs  78/29/56 1040 09/15/14 0410 09/16/14 0415  NA 139 139 142  K 4.3 4.4 4.3  CL 106 104 107  CO2 GLUCOSE 101* 195* 163*  BUN CREATININE 0.92 1.15 1.03  CALCIUM 9.3 8.5* 8.5*   Liver Function Tests:  Recent Labs  09/06/14 1040  AST 23  ALT 15*  ALKPHOS 75  BILITOT 0.5  PROT 7.7  ALBUMIN 3.8   CBC:  Recent Labs  09/06/14 1040 09/15/14 0410 09/16/14 0415  WBC 7.4 10.8* 15.1*  HGB 13.3 11.3* 10.8*  HCT 39.9 34.7* 32.8*  MCV 88.3 87.4 88.2  PLT 238 185 190     Dg Pelvis Portable  09/14/2014   CLINICAL DATA:  Status post right hip arthroplasty.  EXAM: PORTABLE PELVIS 1-2 VIEWS  COMPARISON:  None.  FINDINGS: Right hip arthroplasty is well-seated and well aligned on this single AP view.  There is no acute fracture or evidence of an operative complication.  IMPRESSION: Well-aligned right hip prosthesis.   Electronically Signed   By: Amie Portland M.D.   On: 09/14/2014 13:33   Dg C-arm 1-60 Min-no Report  09/14/2014   CLINICAL DATA: Righ total hip replacement   C-ARM 1-60 MINUTES  Fluoroscopy was utilized by the requesting physician.  No  radiographic  interpretation.     ASSESSMENT/PLAN:  Osteoarthritis S/P right total hip arthroplasty - for Home health PT; continue oxycodone 5 mg 1-2 tabs by mouth every 3 hours when necessary and tramadol 50 mg 1-2 tabs by mouth every 6 hours when necessary for pain; Xarelto 10 mg 1 tab by mouth daily 2 weeks then aspirin 81 mg 1 tab by mouth daily for DVT prophylaxis; and follow-up with Dr. Lequita Halt, orthopedic surgeon, on 09/29/14 Hypertension - continue amlodipine 10 mg 1 tab by mouth daily Constipation - continue Colace 100 mg 1 capsule by mouth twice a day and MiraLAX 17 g by mouth daily when necessary Neuropathy - continue Neurontin 600 mg 2 tabs = 1200 mg by mouth 3 times a day GERD - continue Zantac 300 mg 1 capsule by mouth daily Hyperlipidemia - continue Zocor 20 mg 1 tab by mouth daily at bedtime Anemia, post op blood loss - hemoglobin 10.8; stable    I have filled out patient's discharge paperwork and written prescriptions.  Patient will receive home health PT.  Total discharge time: Less than 30 minutes  Discharge time involved coordination of the discharge process with Child psychotherapist, nursing staff and therapy department. Medical justification for home health services verified.     [facility], NP [employer] [phone]

## 2015-01-10 ENCOUNTER — Telehealth: Payer: Self-pay | Admitting: Neurology

## 2015-01-10 MED ORDER — GABAPENTIN 600 MG PO TABS: 1200.0000 mg | ORAL_TABLET | Freq: Three times a day (TID) | ORAL | Status: DC

## 2015-01-10 NOTE — Telephone Encounter (Signed)
Rx has been sent.  Receipt confirmed by pharmacy.  I called back to advise.  Got no answer.

## 2015-01-10 NOTE — Telephone Encounter (Signed)
The patient needs a refill on Gabapentin 600 Mg. The best number to contact you is [phone]

## 2015-02-16 ENCOUNTER — Other Ambulatory Visit: Payer: Self-pay | Admitting: Cardiology

## 2015-02-16 DIAGNOSIS — I6523 Occlusion and stenosis of bilateral carotid arteries: Secondary | ICD-10-CM

## 2015-02-24 ENCOUNTER — Ambulatory Visit (HOSPITAL_COMMUNITY)
Admission: RE | Admit: 2015-02-24 | Discharge: 2015-02-24 | Disposition: A | Discharge status: Home / Self Care | Payer: Medicare Other | Source: Ambulatory Visit | Attending: Cardiovascular Disease | Admitting: Cardiovascular Disease

## 2015-02-24 DIAGNOSIS — E785 Hyperlipidemia, unspecified: Secondary | ICD-10-CM | POA: Diagnosis not present

## 2015-02-24 DIAGNOSIS — I6523 Occlusion and stenosis of bilateral carotid arteries: Secondary | ICD-10-CM

## 2015-02-24 DIAGNOSIS — I10 Essential (primary) hypertension: Secondary | ICD-10-CM | POA: Diagnosis not present

## 2015-02-24 DIAGNOSIS — I252 Old myocardial infarction: Secondary | ICD-10-CM | POA: Insufficient documentation

## 2015-04-03 ENCOUNTER — Other Ambulatory Visit: Payer: Self-pay | Admitting: Cardiology

## 2015-06-15 ENCOUNTER — Ambulatory Visit (INDEPENDENT_AMBULATORY_CARE_PROVIDER_SITE_OTHER): Payer: Medicare Other | Admitting: Neurology

## 2015-06-15 ENCOUNTER — Encounter: Payer: Self-pay | Admitting: Neurology

## 2015-06-15 VITALS — BP 119/63 | HR 61 | Ht 73.0 in | Wt 229.0 lb

## 2015-06-15 DIAGNOSIS — R202 Paresthesia of skin: Secondary | ICD-10-CM

## 2015-06-15 MED ORDER — GABAPENTIN 600 MG PO TABS: 1200.0000 mg | ORAL_TABLET | Freq: Three times a day (TID) | ORAL | Status: DC

## 2015-06-15 NOTE — Patient Instructions (Signed)
I had a long discussion the patient with regards to his chronic idiopathic peripheral neuropathy which appears to be stable. I gave him a refill of his gabapentin advised him to continue to take 1200 mg 3 times daily. He was advised to get up slowly and avoid sudden movements. He will return for follow-up in one year with Butch Penny, nurse practitioner call earlier if necessary

## 2015-06-15 NOTE — Progress Notes (Signed)
[facility] [address] [place]. [place] [address] [phone]       OFFICE Follow Up Visit NOTE  [name] Date of Birth:  12-18-1929 Medical Record Number:  [id number]   Referring MD:  Dr Jerry [name]  Reason for Referral:  neuropathy HPI: Update 07/14/2013 : He returns for followup of the last visit 6 weeks ago. He reports some improvement in his procedures after increasing the dose of the Augmentin. He however has not stopped sulfadiazine despite being asked to do so as he feels he has taken this medicine since 1968 and his dermatologist prescribed as to him for some rash. He however is within to discuss this with Dr. Nevada [name] his dermatologist to see if he  has somewhat better medications to replace this with. He underwent EMG nerve conduction study performed by Dr. Juleen [name] which confirmed axonal peripheral neuropathy of moderate severity. There was also mild left carpal tunnel. Lab work horn showed normal vitamin B12, serum protein electrophoresis, TSH, ANA, rheumatoid factor and angiotensin-converting enzyme. Vitamin D was slightly low at 24.3 and ESR was  borderline at 31. Patient was advised to discuss low vitamin D. with primary physician but has not yet done so. Initial Consult  05/27/2013 : 82 -year-old Caucasian male who's had progressive lower extremity paresthesias, pain, tingling and burning sensation off-and-on for the last 8 years at least. He states he was evaluated by neurologist Dr. Ron [name] at that time in 2007 and underwent EMG nerve conduction studies as well as lab work but I do not have those records and he states that no definite etiology was found. He has been taking gabapentin 600 times daily for the last one-year the paresthesias have worsened. He denies any weakness in his legs, difficulty with walking, balance problems. He does trip easily and has had a few near falls. He denies any back pain, radicular pain,  t bowel or bladder discomfort. He does have  long-standing history of prostatic problems and recurrent bladder infections and has been taking sulfapyridine for more than 8 years every other day. He has no history of diabetes, drinking alcohol or taking too many multivitamins. He has been on statin for cholesterol but for the last one-year period Update 06/15/2015 : He returns for follow-up after last visit a year ago. He continues to have paresthesias in his feet which is constant but mostly and not bothersome. At times he feels his feet remain cold all the time. He remains on gabapentin 1200 mg 3 times daily which seems to be tolerating well without any side effects. He has not had any new health problems since her last visit on any medication changes. Blood pressure is well controlled and today it is [id number]. He has no new complaints today. ROS:   14 system review of systems is positive for tingling, numbness, burning and pain in feet ,  , restless legs. All other systems negative  PMH:  Past Medical History  Diagnosis Date  . Dyslipidemia     under control  . Medication intolerance     NIACIN  . CAD (coronary artery disease)     no ischemia nuclear scan 12/2005 EF60% /  nuclear, October, 2012, no ischemia, EF normal  . Hiatal hernia   . Peptic ulcer disease     "just one little ulcer"  . Hypertension   . Hx of CABG     2002  . Ejection fraction     EF. 60%, Nuclear, 2007 /   EF,  60%, nuclear, October, 2012  . Carotid artery disease ([place])     Doppler, October, 2011, 40-59% bilateral  . Weight loss     August, 2012  . Myocardial infarction ([place])     1996  . Arthritis     knees and shoulders   . Hearing aid worn     bilateral ears   . Pneumonia     hx of x1  . GERD (gastroesophageal reflux disease)   . Neuropathy [facility])     Social History:  Social History   Social History  . Marital Status: Widowed    Spouse Name: N/A  . Number of Children: 2  . Years of Education: 8   Occupational History  . retired    Social  History Main Topics  . Smoking status: Former Smoker    Types: Cigarettes, Cigars    Quit date: 04/29/1994  . Smokeless tobacco: Never Used     Comment: Quit 35 years ago   . Alcohol Use: No  . Drug Use: No  . Sexual Activity: Not on file   Other Topics Concern  . Not on file   Social History Narrative   Patient lives at home alone . Patient is widowed.   Retired.   Education 8 th grade   Right handed.   Caffeine very little.    Medications:   Current Outpatient Prescriptions on File Prior to Visit  Medication Sig Dispense Refill  . acetaminophen (TYLENOL) 325 MG tablet Take 2 tablets (650 mg total) by mouth every 6 (six) hours as needed for mild pain (or Fever >/= 101). 40 tablet 0  . amLODipine (NORVASC) 10 MG tablet TAKE ONE TABLET BY MOUTH ONCE DAILY. 90 tablet 2  . amLODipine (NORVASC) 10 MG tablet TAKE ONE TABLET BY MOUTH ONCE DAILY. 90 tablet 0  . bisacodyl (DULCOLAX) 10 MG suppository Place 1 suppository (10 mg total) rectally daily as needed for moderate constipation. 12 suppository 0  . cyclobenzaprine (FLEXERIL) 5 MG tablet Take 1 tablet (5 mg total) by mouth 3 (three) times daily as needed for muscle spasms. 90 tablet 0  . docusate sodium (COLACE) 100 MG capsule Take 1 capsule (100 mg total) by mouth 2 (two) times daily. 10 capsule 0  . metoprolol (LOPRESSOR) 50 MG tablet TAKE ONE-HALF TABLET BY MOUTH THREE TIMES DAILY 135 tablet 3  . ondansetron (ZOFRAN) 4 MG tablet Take 1 tablet (4 mg total) by mouth every 6 (six) hours as needed for nausea. 40 tablet 0  . oxyCODONE (OXY IR/ROXICODONE) 5 MG immediate release tablet Take 1-2 tablets (5-10 mg total) by mouth every 3 (three) hours as needed for moderate pain, severe pain or breakthrough pain. 90 tablet 0  . polyethylene glycol (MIRALAX / GLYCOLAX) packet Take 17 g by mouth daily as needed for mild constipation. 14 each 0  . ranitidine (ZANTAC) 300 MG capsule Take 300 mg by mouth daily.     . rivaroxaban (XARELTO) 10 MG  TABS tablet Take 1 tablet (10 mg total) by mouth daily with breakfast. Take Xarelto for two and a half more weeks, then discontinue Xarelto. Once the patient has completed the Xarelto, they may resume the 81 mg Aspirin. 19 tablet 0  . simvastatin (ZOCOR) 20 MG tablet TAKE ONE TABLET BY MOUTH AT BEDTIME 90 tablet 1  . traMADol (ULTRAM) 50 MG tablet Take 1-2 tablets (50-100 mg total) by mouth every 6 (six) hours as needed (MILD PAIN). 60 tablet 1   No current facility-administered  medications on file prior to visit.    Allergies:  No Known Allergies  Physical Exam General: well developed, well nourished elderly caucasian male, seated, in no evident distress Head: head normocephalic and atraumatic. Orohparynx benign Neck: supple with no carotid or supraclavicular bruits Cardiovascular: regular rate and rhythm, no murmurs Musculoskeletal: no deformity Skin:  no rash/petichiae Vascular:  Normal pulses all extremities Filed Vitals:   06/15/15 1404  BP: 119/63  Pulse: 61    Neurologic Exam Mental Status: Awake and fully alert. Oriented to place and time. Recent and remote memory intact. Attention span, concentration and fund of knowledge appropriate. Mood and affect appropriate.  Cranial Nerves: Fundoscopic exam reveals sharp disc margins. Pupils equal, briskly reactive to light. Extraocular movements full without nystagmus. Visual fields full to confrontation. Hearing intact. Facial sensation intact. Face, tongue, palate moves normally and symmetrically.  Motor: Normal bulk and tone. Normal strength in all tested extremity muscles except mild weakness of ankle dorsi flexors and everters in both feet.. Sensory.: Hyperesthesia to touch and pinprick sensation in both feet from ankle down. Mildly mpaired position and vibration sense in both feet from ankle down.  Coordination: Rapid alternating movements normal in all extremities. Finger-to-nose and heel-to-shin performed accurately  bilaterally. Gait and Station: Arises from chair without difficulty. Stance is normal. Gait demonstrates normal stride length and balance . Able to heel, toe and tandem walk with minimal difficulty.  Reflexes: 1+ and symmetric. Toes downgoing.       ASSESSMENT: 35 year pleasant Caucasian male with eight-year history of progressive paresthesias in feet likely from idiopathic peripheral neuropathy. He apparently has had a detailed workup in the past  which was unyielding and may perhaps be related to long term usage of sulphadiazine   PLAN:I had a long discussion the patient with regards to his chronic idiopathic peripheral neuropathy which appears to be stable. I gave him a refill of his gabapentin advised him to continue to take 1200 mg 3 times daily. He was advised to get up slowly and avoid sudden movements. He will return for follow-up in one year with Ward Givens, nurse practitioner call earlier if necessary Antony Contras, MD   Note: This document was prepared with digital dictation and possible smart phrase technology. Any transcriptional errors that result from this process are unintentional.

## 2015-07-03 ENCOUNTER — Other Ambulatory Visit: Payer: Self-pay | Admitting: Cardiology

## 2015-07-10 ENCOUNTER — Ambulatory Visit: Payer: Medicare Other | Admitting: Neurology

## 2015-07-26 ENCOUNTER — Ambulatory Visit (INDEPENDENT_AMBULATORY_CARE_PROVIDER_SITE_OTHER): Payer: Medicare Other | Admitting: Interventional Cardiology

## 2015-07-26 ENCOUNTER — Encounter: Payer: Self-pay | Admitting: Interventional Cardiology

## 2015-07-26 VITALS — BP 115/60 | HR 62 | Ht 73.0 in | Wt 232.0 lb

## 2015-07-26 DIAGNOSIS — I739 Peripheral vascular disease, unspecified: Secondary | ICD-10-CM

## 2015-07-26 DIAGNOSIS — I779 Disorder of arteries and arterioles, unspecified: Secondary | ICD-10-CM

## 2015-07-26 DIAGNOSIS — I2510 Atherosclerotic heart disease of native coronary artery without angina pectoris: Secondary | ICD-10-CM | POA: Diagnosis not present

## 2015-07-26 DIAGNOSIS — I10 Essential (primary) hypertension: Secondary | ICD-10-CM | POA: Diagnosis not present

## 2015-07-26 MED ORDER — ASPIRIN EC 81 MG PO TBEC: 81.0000 mg | DELAYED_RELEASE_TABLET | Freq: Every day | ORAL | Status: AC

## 2015-07-26 NOTE — Progress Notes (Signed)
Patient ID: Jerry [name], male   DOB: 1929-07-06, 82 y.o.   MRN: [id number]     Cardiology Office Note   Date:  07/26/2015   ID:  Jerry [name], DOB Sep 12, 1929, MRN [id number]  PCP:  Delorse Lek, MD    No chief complaint on file. f/u CAD   Wt Readings from Last 3 Encounters:  07/26/15 232 lb (105.235 kg)  06/15/15 229 lb (103.874 kg)  09/22/14 214 lb (97.07 kg)       History of Present Illness: [name] is a 82 y.o. male  Who has had CAD.  He had CABG in 2002.  He has not had any PCI since the CABG.  Stress tests were normal after the CABG in 2007, 2012.  No palpitations.  No chest pain.  No NTG usage.  He had hip surgery in 2016 and did well.  He is able to walk more.  Most strenuous exercise was physical therapy.  Now he walks the treadmill and does some light weights and bicycle.  No problem.    No bleeding problems.    He does not check BP at home.   No longer taking Xarelto.  He is taking a baby aspirin without problems.       Past Medical History  Diagnosis Date  . Dyslipidemia     under control  . Medication intolerance     NIACIN  . CAD (coronary artery disease)     no ischemia nuclear scan 12/2005 EF60% /  nuclear, October, 2012, no ischemia, EF normal  . Hiatal hernia   . Peptic ulcer disease     "just one little ulcer"  . Hypertension   . Hx of CABG     2002  . Ejection fraction     EF. 60%, Nuclear, 2007 /   EF, 60%, nuclear, October, 2012  . Carotid artery disease ([facility])     Doppler, October, 2011, 40-59% bilateral  . Weight loss     August, 2012  . Myocardial infarction ([facility])     1996  . Arthritis     knees and shoulders   . Hearing aid worn     bilateral ears   . Pneumonia     hx of x1  . GERD (gastroesophageal reflux disease)   . Neuropathy [facility])     Past Surgical History  Procedure Laterality Date  . Cardiac catheterization    . Total knee arthroplasty  05/13/2011    Procedure: TOTAL KNEE ARTHROPLASTY;  Surgeon: Loanne Drilling,  MD;  Location: [facility];  Service: Orthopedics;  Laterality: Right;  . Coronary artery bypass graft      5  . Joint replacement Bilateral 2011, 2013    knees  . Hand surgery Right ~1998  . Total hip arthroplasty Right 09/14/2014    Procedure: RIGHT TOTAL HIP ARTHROPLASTY ANTERIOR APPROACH;  Surgeon: Ollen Gross, MD;  Location: [facility];  Service: Orthopedics;  Laterality: Right;     Current Outpatient Prescriptions  Medication Sig Dispense Refill  . acetaminophen (TYLENOL) 325 MG tablet Take 2 tablets (650 mg total) by mouth every 6 (six) hours as needed for mild pain (or Fever >/= 101). 40 tablet 0  . amLODipine (NORVASC) 10 MG tablet TAKE ONE TABLET BY MOUTH ONCE DAILY 90 tablet 0  . gabapentin (NEURONTIN) 600 MG tablet Take 2 tablets (1,200 mg total) by mouth 3 (three) times daily. 540 tablet 1  . metoprolol (LOPRESSOR) 50 MG tablet TAKE ONE-HALF TABLET  BY MOUTH THREE TIMES DAILY 135 tablet 3  . polyethylene glycol (MIRALAX / GLYCOLAX) packet Take 17 g by mouth daily as needed for mild constipation. 14 each 0  . ranitidine (ZANTAC) 300 MG capsule Take 300 mg by mouth daily.     . rivaroxaban (XARELTO) 10 MG TABS tablet Take 1 tablet (10 mg total) by mouth daily with breakfast. Take Xarelto for two and a half more weeks, then discontinue Xarelto. Once the patient has completed the Xarelto, they may resume the 81 mg Aspirin. 19 tablet 0  . simvastatin (ZOCOR) 20 MG tablet TAKE ONE TABLET BY MOUTH AT BEDTIME 90 tablet 1  . aspirin EC 81 MG tablet Take 1 tablet (81 mg total) by mouth daily.     No current facility-administered medications for this visit.    Allergies:   Review of patient's allergies indicates no known allergies.    Social History:  The patient  reports that he quit smoking about 21 years ago. His smoking use included Cigarettes and Cigars. He has never used smokeless tobacco. He reports that he does not drink alcohol or use illicit drugs.   Family History:  The patient's  family history includes Stroke in his father and mother. There is no history of Heart attack.    ROS:  Please see the history of present illness.   Otherwise, review of systems are positive for .   All other systems are reviewed and negative.    PHYSICAL EXAM: VS:  BP 115/60 mmHg  Pulse 62  Ht  (1.854 m)  Wt 232 lb (105.235 kg)  BMI 30.62 kg/m2 , BMI Body mass index is 30.62 kg/(m^2). GEN: Well nourished, well developed, in no acute distress HEENT: normal Neck: no JVD, carotid bruits, or masses Cardiac: RRR; no murmurs, rubs, or gallops,no edema  Respiratory:  clear to auscultation bilaterally, normal work of breathing GI: soft, nontender, nondistended, + BS MS: no deformity or atrophy Skin: warm and dry, no rash Neuro:  Strength and sensation are intact Psych: euthymic mood, full affect   EKG:   The ekg ordered today demonstrates sinus bradycardia, nonspecific ST segment changes.  TWI laterally.   Recent Labs: 09/06/2014: ALT 15* 09/16/2014: BUN 20; Creatinine, Ser 1.03; Hemoglobin 10.8*; Platelets 190; Potassium 4.3; Sodium 142   Lipid Panel    Component Value Date/Time   CHOL 134 01/07/2011 0918   TRIG 86.0 01/07/2011 0918   HDL 43.50 01/07/2011 0918   CHOLHDL 3 01/07/2011 0918   VLDL 17.2 01/07/2011 0918   LDLCALC 73 01/07/2011 0918     Other studies Reviewed: Additional studies/ records that were reviewed today with results demonstrating: prior stress test in 2012 negative for ischemia.   ASSESSMENT AND PLAN:  1. CAD: No angina.  CABG in 2002.  Negative stress test in 2012.   2. HTN: Well controlled.  COntinue current meds.  3. Carotid artery disease: Moderate bilateral.  Continue with regular Dopplers, next to be scheduled in 2018.     Current medicines are reviewed at length with the patient today.  The patient concerns regarding his medicines were addressed.  The following changes have been made:  No change  Labs/ tests ordered today include:    Orders Placed This Encounter  Procedures  . EKG 12-Lead    Recommend 150 minutes/week of aerobic exercise Low fat, low carb, high fiber diet recommended  Disposition:   FU in 1 year   Delorise Jackson., MD  07/26/2015 11:29 AM  [facility] [address], [place], [place]  [address] Phone: [phone]; Fax: [phone]

## 2015-07-26 NOTE — Patient Instructions (Signed)
Medication Instructions:  Your physician recommends that you continue on your current medications as directed. Please refer to the Current Medication list given to you today.   Labwork: NONE  Testing/Procedures: NONE  Follow-Up: Your physician wants you to follow-up in: 1 YEAR WITH DR. VARANASI.  You will receive a reminder letter in the mail two months in advance. If you don't receive a letter, please call our office to schedule the follow-up appointment.   Any Other Special Instructions Will Be Listed Below (If Applicable).     If you need a refill on your cardiac medications before your next appointment, please call your pharmacy.   

## 2015-07-27 ENCOUNTER — Other Ambulatory Visit: Payer: Self-pay

## 2015-07-27 DIAGNOSIS — I779 Disorder of arteries and arterioles, unspecified: Secondary | ICD-10-CM

## 2015-07-27 DIAGNOSIS — I739 Peripheral vascular disease, unspecified: Principal | ICD-10-CM

## 2015-07-31 ENCOUNTER — Other Ambulatory Visit: Payer: Self-pay | Admitting: Cardiology

## 2015-10-30 ENCOUNTER — Other Ambulatory Visit: Payer: Self-pay | Admitting: Interventional Cardiology

## 2016-01-01 ENCOUNTER — Other Ambulatory Visit: Payer: Self-pay | Admitting: Neurology

## 2016-06-17 ENCOUNTER — Encounter: Payer: Self-pay | Admitting: Adult Health

## 2016-06-17 ENCOUNTER — Ambulatory Visit (INDEPENDENT_AMBULATORY_CARE_PROVIDER_SITE_OTHER): Payer: Medicare Other | Admitting: Adult Health

## 2016-06-17 VITALS — BP 152/66 | HR 55 | Ht 73.0 in | Wt 238.6 lb

## 2016-06-17 DIAGNOSIS — R202 Paresthesia of skin: Secondary | ICD-10-CM | POA: Diagnosis not present

## 2016-06-17 MED ORDER — GABAPENTIN 600 MG PO TABS: 1200.0000 mg | ORAL_TABLET | Freq: Three times a day (TID) | ORAL | 4 refills | Status: DC

## 2016-06-17 NOTE — Patient Instructions (Signed)
Decrease Gabapentin: Week 1: 1200 mg in the AM, 600 mg at lunch and 1200 mg at bedtime Week 2 : 600 mg in the AM, 600 mg at lunch and 1200 mg at bedtime Week 3: 600 mg in the AM, 600 mg at lunch and 600 mg at bedtime Continue 600 mg three times a day If your symptoms worsen or you develop new symptoms please let us know.

## 2016-06-17 NOTE — Progress Notes (Signed)
PATIENT: Jerry [name] DOB: 1929-11-25  REASON FOR VISIT: follow up- lower extremity paresthesias HISTORY FROM: patient  HISTORY OF PRESENT ILLNESS: Jerry [name] is an 83 year old male with a history of lower extremity paresthesias. He returns today for follow-up. He is currently on gabapentin 1200 mg 3 times a day. He reports that he is tolerating this well. He states that he has never had any discomfort in the lower legs. Denies any sharp shooting pains. Does report numbness in the lower extremities and they feel cold. Denies any significant changes with his gait or balance. He states that he does stagger sometimes but no falls. Denies any numbness in the hands. He reports some lower back pain however this is not consistent but rather intermittent. He returns today for an evaluation.  HISTORY (SETHI): Initial Consult  05/27/2013 : 83 -year-old Caucasian male who's had progressive lower extremity paresthesias, pain, tingling and burning sensation off-and-on for the last 8 years at least. He states he was evaluated by neurologist Dr. Lissa Morales at that time in 2007 and underwent EMG nerve conduction studies as well as lab work but I do not have those records and he states that no definite etiology was found. He has been taking gabapentin 600 times daily for the last one-year the paresthesias have worsened. He denies any weakness in his legs, difficulty with walking, balance problems. He does trip easily and has had a few near falls. He denies any back pain, radicular pain,  t bowel or bladder discomfort. He does have long-standing history of prostatic problems and recurrent bladder infections and has been taking sulfapyridine for more than 8 years every other day. He has no history of diabetes, drinking alcohol or taking too many multivitamins. He has been on statin for cholesterol but for the last one-year period   REVIEW OF SYSTEMS: Out of a complete 14 system review of symptoms, the patient complains only  of the following symptoms, and all other reviewed systems are negative.  See history of present illness  ALLERGIES: No Known Allergies  HOME MEDICATIONS: Outpatient Medications Prior to Visit  Medication Sig Dispense Refill  . acetaminophen (TYLENOL) 325 MG tablet Take 2 tablets (650 mg total) by mouth every 6 (six) hours as needed for mild pain (or Fever >/= 101). 40 tablet 0  . amLODipine (NORVASC) 10 MG tablet TAKE ONE TABLET BY MOUTH ONCE DAILY. 90 tablet 2  . aspirin EC 81 MG tablet Take 1 tablet (81 mg total) by mouth daily.    Marland Kitchen gabapentin (NEURONTIN) 600 MG tablet TAKE TWO TABLETS BY MOUTH THREE TIMES DAILY 540 tablet 1  . metoprolol (LOPRESSOR) 50 MG tablet TAKE ONE-HALF TABLET BY MOUTH THREE TIMES DAILY. 135 tablet 3  . polyethylene glycol (MIRALAX / GLYCOLAX) packet Take 17 g by mouth daily as needed for mild constipation. 14 each 0  . ranitidine (ZANTAC) 300 MG capsule Take 300 mg by mouth daily.     . rivaroxaban (XARELTO) 10 MG TABS tablet Take 1 tablet (10 mg total) by mouth daily with breakfast. Take Xarelto for two and a half more weeks, then discontinue Xarelto. Once the patient has completed the Xarelto, they may resume the 81 mg Aspirin. 19 tablet 0  . simvastatin (ZOCOR) 20 MG tablet TAKE ONE TABLET BY MOUTH AT BEDTIME 90 tablet 1   No facility-administered medications prior to visit.     PAST MEDICAL HISTORY: Past Medical History:  Diagnosis Date  . Arthritis    knees and shoulders   .  CAD (coronary artery disease)    no ischemia nuclear scan 12/2005 EF60% /  nuclear, October, 2012, no ischemia, EF normal  . Carotid artery disease (HCC)    Doppler, October, 2011, 40-59% bilateral  . Dyslipidemia    under control  . Ejection fraction    EF. 60%, Nuclear, 2007 /   EF, 60%, nuclear, October, 2012  . GERD (gastroesophageal reflux disease)   . Hearing aid worn    bilateral ears   . Hiatal hernia   . Hx of CABG    2002  . Hypertension   . Medication  intolerance    NIACIN  . Myocardial infarction    1996  . Neuropathy (HCC)   . Peptic ulcer disease    "just one little ulcer"  . Pneumonia    hx of x1  . Weight loss    August, 2012    PAST SURGICAL HISTORY: Past Surgical History:  Procedure Laterality Date  . CARDIAC CATHETERIZATION    . CORONARY ARTERY BYPASS GRAFT     5  . HAND SURGERY Right ~1998  . JOINT REPLACEMENT Bilateral 2011, 2013   knees  . TOTAL HIP ARTHROPLASTY Right 09/14/2014   Procedure: RIGHT TOTAL HIP ARTHROPLASTY ANTERIOR APPROACH;  Surgeon: Ollen Gross, MD;  Location: [facility];  Service: Orthopedics;  Laterality: Right;  . TOTAL KNEE ARTHROPLASTY  05/13/2011   Procedure: TOTAL KNEE ARTHROPLASTY;  Surgeon: Loanne Drilling, MD;  Location: [facility];  Service: Orthopedics;  Laterality: Right;    FAMILY HISTORY: Family History  Problem Relation Age of Onset  . Stroke Mother   . Stroke Father   . Heart attack Neg Hx     SOCIAL HISTORY: Social History   Social History  . Marital status: Widowed    Spouse name: N/A  . Number of children: 2  . Years of education: 8   Occupational History  . retired    Social History Main Topics  . Smoking status: Former Smoker    Types: Cigarettes, Cigars    Quit date: 04/29/1994  . Smokeless tobacco: Never Used     Comment: Quit 35 years ago   . Alcohol use No  . Drug use: No  . Sexual activity: Not on file   Other Topics Concern  . Not on file   Social History Narrative   Patient lives at home alone . Patient is widowed.   Retired.   Education 8 th grade   Right handed.   Caffeine very little.      PHYSICAL EXAM  Vitals:   06/17/16 1259  Height: 6\' 1"  (1.854 m)   There is no height or weight on file to calculate BMI.  Generalized: Well developed, in no acute distress   Neurological examination  Mentation: Alert oriented to time, place, history taking. Follows all commands speech and language fluent Cranial nerve II-XII: Pupils were equal  round reactive to light. Extraocular movements were full, visual field were full on confrontational test. Facial sensation and strength were normal. Uvula tongue midline. Head turning and shoulder shrug  were normal and symmetric. Motor: The motor testing reveals 5 over 5 strength of all 4 extremities. Good symmetric motor tone is noted throughout.  Sensory: Sensory testing is intact to soft touch on all 4 extremities. No evidence of extinction is noted.  Coordination: Cerebellar testing reveals good finger-nose-finger and heel-to-shin bilaterally.  Gait and station: Gait is normal. Tandem gait is normal. Romberg is negative. No drift is seen.  Reflexes:  Deep tendon reflexes are symmetric and normal bilaterally.   DIAGNOSTIC DATA (LABS, IMAGING, TESTING) - I reviewed patient records, labs, notes, testing and imaging myself where available.  Lab Results  Component Value Date   WBC 15.1 (H) 09/16/2014   HGB 10.8 (L) 09/16/2014   HCT 32.8 (L) 09/16/2014   MCV 88.2 09/16/2014   PLT 190 09/16/2014      Component Value Date/Time   NA 142 09/16/2014 0415   K 4.3 09/16/2014 0415   CL 107 09/16/2014 0415   CO2 28 09/16/2014 0415   GLUCOSE 163 (H) 09/16/2014 0415   BUN 20 09/16/2014 0415   CREATININE 1.03 09/16/2014 0415   CALCIUM 8.5 (L) 09/16/2014 0415   PROT 7.7 09/06/2014 1040   PROT 7.1 05/27/2013 1408   ALBUMIN 3.8 09/06/2014 1040   AST 23 09/06/2014 1040   ALT 15 (L) 09/06/2014 1040   ALKPHOS 75 09/06/2014 1040   BILITOT 0.5 09/06/2014 1040   GFRNONAA >60 09/16/2014 0415   GFRAA >60 09/16/2014 0415        ASSESSMENT AND PLAN 83 y.o. year old male  has a past medical history of Arthritis; CAD (coronary artery disease); Carotid artery disease (HCC); Dyslipidemia; Ejection fraction; GERD (gastroesophageal reflux disease); Hearing aid worn; Hiatal hernia; CABG; Hypertension; Medication intolerance; Myocardial infarction; Neuropathy (HCC); Peptic ulcer disease; Pneumonia; and  Weight loss. here with:  1. Paresthesias of the lower extremities  The patient is not having any discomfort in the lower extremities. According to the patient has never had any significant pain but rather just numbness. We will slowly try to decrease the patient's gabapentin. I have advised the patient he will decrease gabapentin according to the following schedule:   Week 1: 1200 mg in the AM, 600 mg at lunch and 1200 mg at bedtime Week 2 : 600 mg in the AM, 600 mg at lunch and 1200 mg at bedtime Week 3: 600 mg in the AM, 600 mg at lunch and 600 mg at bedtime  I advised the patient that if he gets to have discomfort can resume taking the current dose. After 4 weeks I will call the patient. If he is tolerating the lower dose of gabapentin we will continue trying to decrease his dose. Patient is amenable to this plan. He will follow-up in one year or sooner if needed.  I spent 15 minutes with the patient 50% of this time was spent reviewing how to decrease his gabapentin dose.  Butch Penny, MSN, NP-C 06/17/2016, 1:05 PM [facility] [address] [place], [place] [address] [phone]

## 2016-07-07 NOTE — Progress Notes (Signed)
Personally  participated in, made any corrections needed, and agree with history, physical, neuro exam,assessment and plan as stated above.    Keeanna Villafranca, MD [facility] 

## 2016-08-13 ENCOUNTER — Ambulatory Visit (HOSPITAL_COMMUNITY)
Admission: RE | Admit: 2016-08-13 | Discharge: 2016-08-13 | Disposition: A | Discharge status: Home / Self Care | Payer: Medicare Other | Source: Ambulatory Visit | Attending: Cardiology | Admitting: Cardiology

## 2016-08-13 DIAGNOSIS — I6523 Occlusion and stenosis of bilateral carotid arteries: Secondary | ICD-10-CM

## 2016-08-13 DIAGNOSIS — I739 Peripheral vascular disease, unspecified: Secondary | ICD-10-CM

## 2016-08-13 DIAGNOSIS — I779 Disorder of arteries and arterioles, unspecified: Secondary | ICD-10-CM | POA: Diagnosis present

## 2016-11-07 NOTE — Progress Notes (Signed)
Cardiology Office Note:    Date:  11/08/2016   ID:  Jerry [name], DOB 31-Aug-1929, MRN [id number]  PCP:  Delorse Lek, MD  Cardiologist:  Dr Eldridge Dace  Referring MD: Delorse Lek, MD   Chief Complaint  Patient presents with  . Follow-up    CAD    History of Present Illness:    Jerry [name] is a 83 y.o. male with a past medical history significant for CAD status post CABG in 2002, MI 1996, hypertension, GERD, dyslipidemia, mild carotid disease with 1-39% down from previous of 40-59% bilateral occlusion and arthritis.  The patient was told to follow up with cardiology by his PCP due to an abnormal EKG which was to be sent here, however, is not here. He has been having no symptoms, no exertional chest discomfort or dyspnea, no orthopnea, PND, edema, palpitations or lightheadedness. He had a hip replacement in 08/2014 after which he completed physical therapy. He continues on a maintenance program of exercise 3 times per week and notes no abnormal symptoms with exercise.  Will repeat EKG here.   The patient had normal stress tests in 2007 and 2012. Has had no PCI since his CABG.  Past Medical History:  Diagnosis Date  . Arthritis    knees and shoulders   . CAD (coronary artery disease)    no ischemia nuclear scan 12/2005 EF60% /  nuclear, October, 2012, no ischemia, EF normal  . Carotid artery disease (HCC)    Doppler, October, 2011, 40-59% bilateral  . Dyslipidemia    under control  . Ejection fraction    EF. 60%, Nuclear, 2007 /   EF, 60%, nuclear, October, 2012  . GERD (gastroesophageal reflux disease)   . Hearing aid worn    bilateral ears   . Hiatal hernia   . Hx of CABG    2002  . Hypertension   . Medication intolerance    NIACIN  . Myocardial infarction    1996  . Neuropathy (HCC)   . Peptic ulcer disease    "just one little ulcer"  . Pneumonia    hx of x1  . Weight loss    August, 2012    Past Surgical History:  Procedure Laterality Date  . CARDIAC  CATHETERIZATION    . CORONARY ARTERY BYPASS GRAFT     5  . HAND SURGERY Right ~1998  . JOINT REPLACEMENT Bilateral 2011, 2013   knees  . TOTAL HIP ARTHROPLASTY Right 09/14/2014   Procedure: RIGHT TOTAL HIP ARTHROPLASTY ANTERIOR APPROACH;  Surgeon: Ollen Gross, MD;  Location: [facility];  Service: Orthopedics;  Laterality: Right;  . TOTAL KNEE ARTHROPLASTY  05/13/2011   Procedure: TOTAL KNEE ARTHROPLASTY;  Surgeon: Loanne Drilling, MD;  Location: [facility];  Service: Orthopedics;  Laterality: Right;    Current Medications: Current Meds  Medication Sig  . amLODipine (NORVASC) 10 MG tablet TAKE ONE TABLET BY MOUTH ONCE DAILY.  Marland Kitchen aspirin EC 81 MG tablet Take 1 tablet (81 mg total) by mouth daily.  . fluticasone (FLONASE) 50 MCG/ACT nasal spray 1 spray.  . gabapentin (NEURONTIN) 600 MG tablet Take 2 tablets (1,200 mg total) by mouth 3 (three) times daily.  . metoprolol (LOPRESSOR) 50 MG tablet TAKE ONE-HALF TABLET BY MOUTH THREE TIMES DAILY.  Marland Kitchen polyethylene glycol (MIRALAX / GLYCOLAX) packet Take 17 g by mouth daily as needed for mild constipation.  . ranitidine (ZANTAC) 300 MG capsule Take 300 mg by mouth daily.   . simvastatin (  ZOCOR) 20 MG tablet TAKE ONE TABLET BY MOUTH AT BEDTIME     Allergies:   Niacin   Social History   Social History  . Marital status: Widowed    Spouse name: N/A  . Number of children: 2  . Years of education: 8   Occupational History  . retired    Social History Main Topics  . Smoking status: Former Smoker    Types: Cigarettes, Cigars    Quit date: 04/29/1994  . Smokeless tobacco: Never Used     Comment: Quit 35 years ago   . Alcohol use No  . Drug use: No  . Sexual activity: Not on file   Other Topics Concern  . Not on file   Social History Narrative   Patient lives at home alone . Patient is widowed.   Retired.   Education 8 th grade   Right handed.   Caffeine very little.     Family History: The patient's family history includes Stroke in his  father and mother. There is no history of Heart attack. ROS:   Please see the history of present illness.     All other systems reviewed and are negative.  EKGs/Labs/Other Studies Reviewed:    The following studies were reviewed today:  Carotid duplex scan 08/13/16 Heterogeneous plaque, bilaterally. Bilateral ICA velocities are stable compared to prior exam, now in 1-39% range of stenosis, higher end. >50% RECA stenosis. Normal subclavian arteries, bilaterally. Patent vertebral arteries with antegrade flow.  EKG:  EKG is  ordered today.  The ekg ordered today demonstrates Sinus bradycardia with 1st degree AVB, PRI 250, T wave abnormalities unchanged from previous. No acute changes. QTC 418  Recent Labs: No results found for requested labs within last 8760 hours.   Recent Lipid Panel    Component Value Date/Time   CHOL 134 01/07/2011 0918   TRIG 86.0 01/07/2011 0918   HDL 43.50 01/07/2011 0918   CHOLHDL 3 01/07/2011 0918   VLDL 17.2 01/07/2011 0918   LDLCALC 73 01/07/2011 0918    Physical Exam:    VS:  BP 114/62   Pulse 65   Ht 6\' 1"  (1.854 m)   Wt 229 lb (103.9 kg)   BMI 30.21 kg/m     Wt Readings from Last 3 Encounters:  11/08/16 229 lb (103.9 kg)  06/17/16 238 lb 9.6 oz (108.2 kg)  07/26/15 232 lb (105.2 kg)     GEN:  Well nourished, well developed in no acute distress HEENT: Normal NECK: No JVD; No carotid bruits LYMPHATICS: No lymphadenopathy CARDIAC: RRR, no murmurs, rubs, gallops RESPIRATORY:  Clear to auscultation without rales, wheezing or rhonchi  ABDOMEN: Soft, non-tender, non-distended MUSCULOSKELETAL:  No edema; No deformity  SKIN: Warm and dry NEUROLOGIC:  Alert and oriented x 3 PSYCHIATRIC:  Normal affect   ASSESSMENT:    No diagnosis found. PLAN:    In order of problems listed above:  CAD: History of remote CABG in 2002 with no subsequent cardiac events. Pt is stable with no symptoms. His metoprolol was reduced to 25 mg BID from TID by PCP  likely due to bradycardia. He continues on statin, aspirin and amlodipine. He reports that his PCP noted an abnormal EKG. Will try to get that EKG. EKG done here is unchanged from previous. Continue current therapy and follow up with Dr. Eldridge Dace in 6 months unless significant abnormality seen on EKG done at PCP.  Hypertension: BP is well controlled on current regimen. Continue amlodipine 10 mg  and metoprolol 25 mg bid.  Carotid artery disease: Was identified as moderate in the past, however carotid dopplers in 07/2016 showed mild plaque buildup with bilateral 1-39% (in the upper range). Pt asymptomatic. No need for surgical intervention.   Hyperlipidemia: LDL 98 on 08/08/16. Not to goal of <70 with known CAD. Favor atorvastatin as high intensity therapy and considering that simvastatin has potential interaction with amlodipine. Discussed options with patient of switching or maintaining current regimen which would also be reasonable and he would like to switch to atorvastatin 40mg  daily to maximize CVD risk reduction. Will recheck FLP and LFTs in 6 weeks.    Medication Adjustments/Labs and Tests Ordered: Current medicines are reviewed at length with the patient today.  Concerns regarding medicines are outlined above. Labs and tests ordered and medication changes are outlined in the patient instructions below:  There are no Patient Instructions on file for this visit.   Signed, Berton Bon, NP  11/08/2016 8:48 AM    [facility]

## 2016-11-08 ENCOUNTER — Encounter: Payer: Self-pay | Admitting: Cardiology

## 2016-11-08 ENCOUNTER — Ambulatory Visit (INDEPENDENT_AMBULATORY_CARE_PROVIDER_SITE_OTHER): Payer: Medicare Other | Admitting: Cardiology

## 2016-11-08 ENCOUNTER — Encounter (INDEPENDENT_AMBULATORY_CARE_PROVIDER_SITE_OTHER): Payer: Self-pay

## 2016-11-08 VITALS — BP 112/58 | HR 65 | Ht 73.0 in | Wt 229.0 lb

## 2016-11-08 DIAGNOSIS — E784 Other hyperlipidemia: Secondary | ICD-10-CM

## 2016-11-08 DIAGNOSIS — E7849 Other hyperlipidemia: Secondary | ICD-10-CM

## 2016-11-08 DIAGNOSIS — I10 Essential (primary) hypertension: Secondary | ICD-10-CM | POA: Diagnosis not present

## 2016-11-08 DIAGNOSIS — I2510 Atherosclerotic heart disease of native coronary artery without angina pectoris: Secondary | ICD-10-CM

## 2016-11-08 DIAGNOSIS — I779 Disorder of arteries and arterioles, unspecified: Secondary | ICD-10-CM

## 2016-11-08 DIAGNOSIS — I739 Peripheral vascular disease, unspecified: Secondary | ICD-10-CM

## 2016-11-08 MED ORDER — METOPROLOL TARTRATE 25 MG PO TABS: 25.0000 mg | ORAL_TABLET | Freq: Two times a day (BID) | ORAL | 1 refills | Status: DC

## 2016-11-08 MED ORDER — ATORVASTATIN CALCIUM 40 MG PO TABS: 40.0000 mg | ORAL_TABLET | Freq: Every day | ORAL | 1 refills | Status: DC

## 2016-11-08 NOTE — Patient Instructions (Addendum)
Medication Instructions:   STOP TAKING SIMVASTATIN  20 MG   1. START TAKING ATORVASTATIN 40 MG ONCE A DAY   2. START TAKING METOPROLOL 25 MG TWICE  A DAY    If you need a refill on your cardiac medications before your next appointment, please call your pharmacy.  Labwork: RETURN IN 6 WEEKS FOR A FASTING  LIPID AND LIVER PANEL    Testing/Procedures: NONE ORDERED  TODAY    Follow-Up:  Your physician wants you to follow-up in:  IN  6  MONTHS WITH DR Eldridge Dace You will receive a reminder letter in the mail two months in advance. If you don't receive a letter, please call our office to schedule the follow-up appointment.       Any Other Special Instructions Will Be Listed Below (If Applicable).

## 2016-12-19 ENCOUNTER — Encounter (INDEPENDENT_AMBULATORY_CARE_PROVIDER_SITE_OTHER): Payer: Self-pay

## 2016-12-19 ENCOUNTER — Other Ambulatory Visit: Payer: Medicare Other | Admitting: *Deleted

## 2016-12-19 DIAGNOSIS — E7849 Other hyperlipidemia: Secondary | ICD-10-CM

## 2016-12-19 LAB — LIPID PANEL
CHOL/HDL RATIO: 3 ratio (ref 0.0–5.0)
CHOLESTEROL TOTAL: 109 mg/dL (ref 100–199)
HDL: 36 mg/dL — ABNORMAL LOW (ref >39)
LDL Calculated: 54 mg/dL (ref 0–99)
TRIGLYCERIDES: 95 mg/dL (ref 0–149)
VLDL Cholesterol Cal: 19 mg/dL (ref 5–40)

## 2016-12-19 LAB — HEPATIC FUNCTION PANEL
ALK PHOS: 66 IU/L (ref 39–117)
ALT: 18 IU/L (ref 0–44)
AST: 23 IU/L (ref 0–40)
Albumin: 4.1 g/dL (ref 3.5–4.7)
BILIRUBIN TOTAL: 0.7 mg/dL (ref 0.0–1.2)
BILIRUBIN, DIRECT: 0.18 mg/dL (ref 0.00–0.40)
Total Protein: 6.1 g/dL (ref 6.0–8.5)

## 2017-02-27 ENCOUNTER — Encounter (HOSPITAL_COMMUNITY): Payer: Medicare Other

## 2017-04-01 ENCOUNTER — Other Ambulatory Visit: Payer: Self-pay | Admitting: Physician Assistant

## 2017-04-01 ENCOUNTER — Ambulatory Visit
Admission: RE | Admit: 2017-04-01 | Discharge: 2017-04-01 | Disposition: A | Discharge status: Home / Self Care | Payer: Medicare Other | Source: Ambulatory Visit | Attending: Physician Assistant | Admitting: Physician Assistant

## 2017-04-01 DIAGNOSIS — M545 Low back pain, unspecified: Secondary | ICD-10-CM

## 2017-04-23 DIAGNOSIS — Z974 Presence of external hearing-aid: Secondary | ICD-10-CM | POA: Insufficient documentation

## 2017-04-23 DIAGNOSIS — K219 Gastro-esophageal reflux disease without esophagitis: Secondary | ICD-10-CM | POA: Insufficient documentation

## 2017-04-23 DIAGNOSIS — K449 Diaphragmatic hernia without obstruction or gangrene: Secondary | ICD-10-CM | POA: Insufficient documentation

## 2017-04-23 DIAGNOSIS — G629 Polyneuropathy, unspecified: Secondary | ICD-10-CM | POA: Insufficient documentation

## 2017-04-23 DIAGNOSIS — J189 Pneumonia, unspecified organism: Secondary | ICD-10-CM | POA: Insufficient documentation

## 2017-04-23 DIAGNOSIS — I219 Acute myocardial infarction, unspecified: Secondary | ICD-10-CM | POA: Insufficient documentation

## 2017-04-23 DIAGNOSIS — M1990 Unspecified osteoarthritis, unspecified site: Secondary | ICD-10-CM | POA: Insufficient documentation

## 2017-05-14 ENCOUNTER — Ambulatory Visit: Payer: Medicare Other | Admitting: Interventional Cardiology

## 2017-05-19 NOTE — Progress Notes (Signed)
Cardiology Office Note   Date:  05/20/2017   ID:  Jerry [name], DOB 12/16/1929, MRN [id number]  PCP:  Charlies Silvers, PA-C    No chief complaint on file. CAD   Wt Readings from Last 3 Encounters:  05/20/17 220 lb 6.4 oz (100 kg)  11/08/16 229 lb (103.9 kg)  06/17/16 238 lb 9.6 oz (108.2 kg)       History of Present Illness: Jerry [name] is a 84 y.o. male  Who has had CAD.  He was followed by Dr. Myrtis Ser for many years.  He had CABG in 2002.  He has not had any PCI since the CABG.  Stress tests were normal after the CABG in 2007, 2012.  No palpitations.  No chest pain.  No NTG usage.  He had hip surgery in 2016 and did well.  No longer taking Xarelto.  He is taking a baby aspirin without problems.   At last visit, was walking on the treadmill and did some light weights and bicycle.   Over the last few weeks, he has not exercised because of the flu.  Denies : Chest pain. Dizziness. Leg edema. Nitroglycerin use. Orthopnea. Palpitations. Paroxysmal nocturnal dyspnea. Shortness of breath. Syncope.   He was diagnosed with the flu in early January and was treated with Tamiflu.     Past Medical History:  Diagnosis Date  . Arthritis    knees and shoulders   . CAD (coronary artery disease)    no ischemia nuclear scan 12/2005 EF60% /  nuclear, October, 2012, no ischemia, EF normal  . Carotid artery disease (HCC)    Doppler, October, 2011, 40-59% bilateral  . Dyslipidemia    under control  . Ejection fraction    EF. 60%, Nuclear, 2007 /   EF, 60%, nuclear, October, 2012  . GERD (gastroesophageal reflux disease)   . Hearing aid worn    bilateral ears   . Hiatal hernia   . Hx of CABG    2002  . Hypertension   . Medication intolerance    NIACIN  . Myocardial infarction (HCC)    1996  . Neuropathy   . Peptic ulcer disease    "just one little ulcer"  . Pneumonia    hx of x1  . Weight loss    August, 2012    Past Surgical History:  Procedure Laterality Date  .  CARDIAC CATHETERIZATION    . CORONARY ARTERY BYPASS GRAFT     5  . HAND SURGERY Right ~1998  . JOINT REPLACEMENT Bilateral 2011, 2013   knees  . TOTAL HIP ARTHROPLASTY Right 09/14/2014   Procedure: RIGHT TOTAL HIP ARTHROPLASTY ANTERIOR APPROACH;  Surgeon: Ollen Gross, MD;  Location: [facility];  Service: Orthopedics;  Laterality: Right;  . TOTAL KNEE ARTHROPLASTY  05/13/2011   Procedure: TOTAL KNEE ARTHROPLASTY;  Surgeon: Loanne Drilling, MD;  Location: [facility];  Service: Orthopedics;  Laterality: Right;     Current Outpatient Medications  Medication Sig Dispense Refill  . amLODipine (NORVASC) 10 MG tablet TAKE ONE TABLET BY MOUTH ONCE DAILY. 90 tablet 2  . aspirin EC 81 MG tablet Take 1 tablet (81 mg total) by mouth daily.    . fluticasone (FLONASE) 50 MCG/ACT nasal spray 1 spray.    . gabapentin (NEURONTIN) 300 MG capsule Take 600 mg by mouth 3 (three) times daily.    . metoprolol succinate (TOPROL-XL) 50 MG 24 hr tablet Take 50 mg by mouth daily. Take with or  immediately following a meal.    . polyethylene glycol (MIRALAX / GLYCOLAX) packet Take 17 g by mouth daily as needed for mild constipation. 14 each 0  . ranitidine (ZANTAC) 300 MG capsule Take 300 mg by mouth daily.     . simvastatin (ZOCOR) 20 MG tablet Take 20 mg by mouth daily.    Marland Kitchen tiZANidine (ZANAFLEX) 2 MG tablet Take 2 mg by mouth daily.    Marland Kitchen atorvastatin (LIPITOR) 40 MG tablet Take 1 tablet (40 mg total) by mouth daily. 90 tablet 1  . nitroGLYCERIN (NITROSTAT) 0.4 MG SL tablet Place 1 tablet (0.4 mg total) under the tongue every 5 (five) minutes as needed for chest pain. 25 tablet 3   No current facility-administered medications for this visit.     Allergies:   Niacin    Social History:  The patient  reports that he quit smoking about 23 years ago. His smoking use included cigarettes and cigars. he has never used smokeless tobacco. He reports that he does not drink alcohol or use drugs.   Family History:  The  patient'sfamily history includes Stroke in his father and mother.    ROS:  Please see the history of present illness.   Otherwise, review of systems are positive for recent flu.   All other systems are reviewed and negative.    PHYSICAL EXAM: VS:  BP (!) 116/50   Pulse (!) 53   Ht 6\' 1"  (1.854 m)   Wt 220 lb 6.4 oz (100 kg)   SpO2 98%   BMI 29.08 kg/m  , BMI Body mass index is 29.08 kg/m. GEN: Well nourished, well developed, in no acute distress  HEENT: normal  Neck: no JVD, carotid bruits, or masses Cardiac: irregualrly irregular; no murmurs, rubs, or gallops,no edema  Respiratory:  clear to auscultation bilaterally, normal work of breathing GI: soft, nontender, nondistended, + BS MS: no deformity or atrophy  Skin: warm and dry, no rash Neuro:  Strength and sensation are intact Psych: euthymic mood, full affect   EKG:   The ekg ordered today demonstrates NSR, sinus arrhythmia, inf Q waves, lateral T wave inversion, no change   Recent Labs: 12/19/2016: ALT 18   Lipid Panel    Component Value Date/Time   CHOL 109 12/19/2016 0728   TRIG 95 12/19/2016 0728   HDL 36 (L) 12/19/2016 0728   CHOLHDL 3.0 12/19/2016 0728   CHOLHDL 3 01/07/2011 0918   VLDL 17.2 01/07/2011 0918   LDLCALC 54 12/19/2016 0728     Other studies Reviewed: Additional studies/ records that were reviewed today with results demonstrating: LDL controled in 2018. 4/18: mild carotid plaque.   ASSESSMENT AND PLAN:  1. CAD: We will give prescription for sublingual nitroglycerin just in case he does develop any symptoms.  No angina on current medical therapy.  He will hopefully get back to regular exercise now that he is over his flu illness. 2. HTN: Blood pressure well controlled.  Continue current medications. 3. Hyperlipidemia: LDL 54.  Continue simvastatin. 4. Irregular rhythm on exam from sinus arrhythmia.  No evidence of AFib.     Current medicines are reviewed at length with the patient today.   The patient concerns regarding his medicines were addressed.  The following changes have been made:  No change  Labs/ tests ordered today include:   Orders Placed This Encounter  Procedures  . EKG 12-Lead    Recommend 150 minutes/week of aerobic exercise Low fat, low carb, high fiber diet  recommended  Disposition:   FU in 1 year   Signed, Lance Muss, MD  05/20/2017 10:54 AM    [facility] [address] [place], [place], [place]  [address] Phone: [phone]; Fax: [phone]

## 2017-05-20 ENCOUNTER — Ambulatory Visit: Payer: PRIVATE HEALTH INSURANCE | Admitting: Interventional Cardiology

## 2017-05-20 ENCOUNTER — Encounter: Payer: Self-pay | Admitting: Interventional Cardiology

## 2017-05-20 ENCOUNTER — Ambulatory Visit (INDEPENDENT_AMBULATORY_CARE_PROVIDER_SITE_OTHER): Payer: Medicare Other | Admitting: Interventional Cardiology

## 2017-05-20 VITALS — BP 116/50 | HR 53 | Ht 73.0 in | Wt 220.4 lb

## 2017-05-20 DIAGNOSIS — I6523 Occlusion and stenosis of bilateral carotid arteries: Secondary | ICD-10-CM | POA: Diagnosis not present

## 2017-05-20 DIAGNOSIS — Z951 Presence of aortocoronary bypass graft: Secondary | ICD-10-CM

## 2017-05-20 DIAGNOSIS — I2510 Atherosclerotic heart disease of native coronary artery without angina pectoris: Secondary | ICD-10-CM | POA: Diagnosis not present

## 2017-05-20 DIAGNOSIS — E7849 Other hyperlipidemia: Secondary | ICD-10-CM | POA: Diagnosis not present

## 2017-05-20 DIAGNOSIS — I10 Essential (primary) hypertension: Secondary | ICD-10-CM | POA: Diagnosis not present

## 2017-05-20 MED ORDER — NITROGLYCERIN 0.4 MG SL SUBL: 0.4000 mg | SUBLINGUAL_TABLET | SUBLINGUAL | 3 refills | Status: DC | PRN

## 2017-05-20 NOTE — Patient Instructions (Signed)
Medication Instructions:  Your physician has recommended you make the following change in your medication:   START: Sublingual Nitroglycerin AS NEEDED for chest pain: Take as directed  Labwork: None ordered  Testing/Procedures: None ordered  Follow-Up: Your physician wants you to follow-up in: 1 year with Dr. Eldridge Dace. You will receive a reminder letter in the mail two months in advance. If you don't receive a letter, please call our office to schedule the follow-up appointment.   Any Other Special Instructions Will Be Listed Below (If Applicable).   Nitroglycerin sublingual tablets What is this medicine? NITROGLYCERIN (nye troe GLI ser in) is a type of vasodilator. It relaxes blood vessels, increasing the blood and oxygen supply to your heart. This medicine is used to relieve chest pain caused by angina. It is also used to prevent chest pain before activities like climbing stairs, going outdoors in cold weather, or sexual activity. This medicine may be used for other purposes; ask your health care provider or pharmacist if you have questions. COMMON BRAND NAME(S): Nitroquick, Nitrostat, Nitrotab What should I tell my health care provider before I take this medicine? They need to know if you have any of these conditions: -anemia -head injury, recent stroke, or bleeding in the brain -liver disease -previous heart attack -an unusual or allergic reaction to nitroglycerin, other medicines, foods, dyes, or preservatives -pregnant or trying to get pregnant -breast-feeding How should I use this medicine? Take this medicine by mouth as needed. At the first sign of an angina attack (chest pain or tightness) place one tablet under your tongue. You can also take this medicine 5 to 10 minutes before an event likely to produce chest pain. Follow the directions on the prescription label. Let the tablet dissolve under the tongue. Do not swallow whole. Replace the dose if you accidentally swallow  it. It will help if your mouth is not dry. Saliva around the tablet will help it to dissolve more quickly. Do not eat or drink, smoke or chew tobacco while a tablet is dissolving. If you are not better within 5 minutes after taking ONE dose of nitroglycerin, call 9-1-1 immediately to seek emergency medical care. Do not take more than 3 nitroglycerin tablets over 15 minutes. If you take this medicine often to relieve symptoms of angina, your doctor or health care professional may provide you with different instructions to manage your symptoms. If symptoms do not go away after following these instructions, it is important to call 9-1-1 immediately. Do not take more than 3 nitroglycerin tablets over 15 minutes. Talk to your pediatrician regarding the use of this medicine in children. Special care may be needed. Overdosage: If you think you have taken too much of this medicine contact a poison control center or emergency room at once. NOTE: This medicine is only for you. Do not share this medicine with others. What if I miss a dose? This does not apply. This medicine is only used as needed. What may interact with this medicine? Do not take this medicine with any of the following medications: -certain migraine medicines like ergotamine and dihydroergotamine (DHE) -medicines used to treat erectile dysfunction like sildenafil, tadalafil, and vardenafil -riociguat This medicine may also interact with the following medications: -alteplase -aspirin -heparin -medicines for high blood pressure -medicines for mental depression -other medicines used to treat angina -phenothiazines like chlorpromazine, mesoridazine, prochlorperazine, thioridazine This list may not describe all possible interactions. Give your health care provider a list of all the medicines, herbs, non-prescription drugs, or  dietary supplements you use. Also tell them if you smoke, drink alcohol, or use illegal drugs. Some items may interact  with your medicine. What should I watch for while using this medicine? Tell your doctor or health care professional if you feel your medicine is no longer working. Keep this medicine with you at all times. Sit or lie down when you take your medicine to prevent falling if you feel dizzy or faint after using it. Try to remain calm. This will help you to feel better faster. If you feel dizzy, take several deep breaths and lie down with your feet propped up, or bend forward with your head resting between your knees. You may get drowsy or dizzy. Do not drive, use machinery, or do anything that needs mental alertness until you know how this drug affects you. Do not stand or sit up quickly, especially if you are an older patient. This reduces the risk of dizzy or fainting spells. Alcohol can make you more drowsy and dizzy. Avoid alcoholic drinks. Do not treat yourself for coughs, colds, or pain while you are taking this medicine without asking your doctor or health care professional for advice. Some ingredients may increase your blood pressure. What side effects may I notice from receiving this medicine? Side effects that you should report to your doctor or health care professional as soon as possible: -blurred vision -dry mouth -skin rash -sweating -the feeling of extreme pressure in the head -unusually weak or tired Side effects that usually do not require medical attention (report to your doctor or health care professional if they continue or are bothersome): -flushing of the face or neck -headache -irregular heartbeat, palpitations -nausea, vomiting This list may not describe all possible side effects. Call your doctor for medical advice about side effects. You may report side effects to FDA at 1-800-FDA-1088. Where should I keep my medicine? Keep out of the reach of children. Store at room temperature between 20 and 25 degrees C (68 and 77 degrees F). Store in Retail buyer. Protect from  light and moisture. Keep tightly closed. Throw away any unused medicine after the expiration date. NOTE: This sheet is a summary. It may not cover all possible information. If you have questions about this medicine, talk to your doctor, pharmacist, or health care provider.  2018 Elsevier/Gold Standard (2013-02-11 17:57:36)   If you need a refill on your cardiac medications before your next appointment, please call your pharmacy.

## 2017-06-17 ENCOUNTER — Encounter: Payer: Self-pay | Admitting: Adult Health

## 2017-06-17 ENCOUNTER — Ambulatory Visit (INDEPENDENT_AMBULATORY_CARE_PROVIDER_SITE_OTHER): Payer: Medicare Other | Admitting: Adult Health

## 2017-06-17 VITALS — BP 145/63 | HR 63 | Ht 73.0 in | Wt 226.6 lb

## 2017-06-17 DIAGNOSIS — R202 Paresthesia of skin: Secondary | ICD-10-CM | POA: Diagnosis not present

## 2017-06-17 DIAGNOSIS — I6523 Occlusion and stenosis of bilateral carotid arteries: Secondary | ICD-10-CM | POA: Diagnosis not present

## 2017-06-17 NOTE — Patient Instructions (Addendum)
Your Plan:  Decrease Gabapentin  Week 1: 1/2 tablet AM, 1 tablet PM Week 2: 1/2 tablet twice a day Week 3: 1/3 tablet daily for 1 week Then stop medication  If you begin having pain in the legs do not continue to wean off gabapentin   Thank you for coming to see [place] at [facility]. I hope we have been able to provide you high quality care today.  You may receive a patient satisfaction survey over the next few weeks. We would appreciate your feedback and comments so that we may continue to improve ourselves and the health of our patients.

## 2017-06-17 NOTE — Progress Notes (Signed)
PATIENT: Jerry [name] DOB: [birth year]  REASON FOR VISIT: follow up HISTORY FROM: patient  HISTORY OF PRESENT ILLNESS: Today 06/17/17 Jerry [name] is an 84 year old male with a history of lower extremity paresthesias.  He returns today for follow-up.  At the last visit we were slowly reducing his dose of gabapentin.  He is currently taking gabapentin 600 mg twice a day.  He reports that he has not had any change in his symptoms.  He denies any pain in the lower extremities.  Reports that his legs also feel cold and numb but denies any burning or tingling pain.  He denies any significant changes with his gait or balance.  He returns today for an evaluation.  HISTORY 06/17/16: Jerry [name] is an 84 year old male with a history of lower extremity paresthesias. He returns today for follow-up. He is currently on gabapentin 1200 mg 3 times a day. He reports that he is tolerating this well. He states that he has never had any discomfort in the lower legs. Denies any sharp shooting pains. Does report numbness in the lower extremities and they feel cold. Denies any significant changes with his gait or balance. He states that he does stagger sometimes but no falls. Denies any numbness in the hands. He reports some lower back pain however this is not consistent but rather intermittent. He returns today for an evaluation.  HISTORY (SETHI): Initial Consult 05/27/2013 : 8 -year-old Caucasian male who's had progressive lower extremity paresthesias, pain, tingling and burning sensation off-and-on for the last 8 years at least. He states he was evaluated by neurologist Dr. Lissa Morales at that time in 2007 and underwent EMG nerve conduction studies as well as lab work but I do not have those records and he states that no definite etiology was found. He has been taking gabapentin 600 times daily for the last one-year the paresthesias have worsened. He denies any weakness in his legs, difficulty with walking, balance problems. He  does trip easily and has had a few near falls. He denies any back pain, radicular pain, t bowel or bladder discomfort. He does have long-standing history of prostatic problems and recurrent bladder infections and has been taking sulfapyridine for more than 8 years every other day. He has no history of diabetes, drinking alcohol or taking too many multivitamins. He has been on statin for cholesterol but for the last one-year period  REVIEW OF SYSTEMS: Out of a complete 14 system review of symptoms, the patient complains only of the following symptoms, and all other reviewed systems are negative.  See HPI  ALLERGIES: Allergies  Allergen Reactions  . Niacin Other (See Comments)    Unknown     HOME MEDICATIONS: Outpatient Medications Prior to Visit  Medication Sig Dispense Refill  . amLODipine (NORVASC) 10 MG tablet TAKE ONE TABLET BY MOUTH ONCE DAILY. 90 tablet 2  . aspirin EC 81 MG tablet Take 1 tablet (81 mg total) by mouth daily.    Marland Kitchen atorvastatin (LIPITOR) 40 MG tablet Take 1 tablet (40 mg total) by mouth daily. 90 tablet 1  . fluticasone (FLONASE) 50 MCG/ACT nasal spray 1 spray.    . gabapentin (NEURONTIN) 300 MG capsule Take 600 mg by mouth 3 (three) times daily.    . metoprolol succinate (TOPROL-XL) 50 MG 24 hr tablet Take 50 mg by mouth daily. Take with or immediately following a meal.    . nitroGLYCERIN (NITROSTAT) 0.4 MG SL tablet Place 1 tablet (0.4 mg total) under the  tongue every 5 (five) minutes as needed for chest pain. 25 tablet 3  . polyethylene glycol (MIRALAX / GLYCOLAX) packet Take 17 g by mouth daily as needed for mild constipation. 14 each 0  . ranitidine (ZANTAC) 300 MG capsule Take 300 mg by mouth daily.     Marland Kitchen tiZANidine (ZANAFLEX) 2 MG tablet Take 2 mg by mouth daily.    . simvastatin (ZOCOR) 20 MG tablet Take 20 mg by mouth daily.     No facility-administered medications prior to visit.     PAST MEDICAL HISTORY: Past Medical History:  Diagnosis Date  .  Arthritis    knees and shoulders   . CAD (coronary artery disease)    no ischemia nuclear scan 12/2005 EF60% /  nuclear, October, 2012, no ischemia, EF normal  . Carotid artery disease (HCC)    Doppler, October, 2011, 40-59% bilateral  . Dyslipidemia    under control  . Ejection fraction    EF. 60%, Nuclear, 2007 /   EF, 60%, nuclear, October, 2012  . GERD (gastroesophageal reflux disease)   . Hearing aid worn    bilateral ears   . Hiatal hernia   . Hx of CABG    2002  . Hypertension   . Medication intolerance    NIACIN  . Myocardial infarction (HCC)    1996  . Neuropathy   . Peptic ulcer disease    "just one little ulcer"  . Pneumonia    hx of x1  . Weight loss    August, 2012    PAST SURGICAL HISTORY: Past Surgical History:  Procedure Laterality Date  . CARDIAC CATHETERIZATION    . CORONARY ARTERY BYPASS GRAFT     5  . HAND SURGERY Right ~1998  . JOINT REPLACEMENT Bilateral 2011, 2013   knees  . TOTAL HIP ARTHROPLASTY Right 09/14/2014   Procedure: RIGHT TOTAL HIP ARTHROPLASTY ANTERIOR APPROACH;  Surgeon: Ollen Gross, MD;  Location: [facility];  Service: Orthopedics;  Laterality: Right;  . TOTAL KNEE ARTHROPLASTY  05/13/2011   Procedure: TOTAL KNEE ARTHROPLASTY;  Surgeon: Loanne Drilling, MD;  Location: [facility];  Service: Orthopedics;  Laterality: Right;    FAMILY HISTORY: Family History  Problem Relation Age of Onset  . Stroke Mother   . Stroke Father   . Heart attack Neg Hx     SOCIAL HISTORY: Social History   Socioeconomic History  . Marital status: Widowed    Spouse name: Not on file  . Number of children: 2  . Years of education: 8  . Highest education level: Not on file  Social Needs  . Financial resource strain: Not on file  . Food insecurity - worry: Not on file  . Food insecurity - inability: Not on file  . Transportation needs - medical: Not on file  . Transportation needs - non-medical: Not on file  Occupational History  . Occupation: retired   Tobacco Use  . Smoking status: Former Smoker    Types: Cigarettes, Cigars    Last attempt to quit: 04/29/1994    Years since quitting: 23.1  . Smokeless tobacco: Never Used  . Tobacco comment: Quit 35 years ago   Substance and Sexual Activity  . Alcohol use: No    Alcohol/week: 0.0 oz  . Drug use: No  . Sexual activity: Not on file  Other Topics Concern  . Not on file  Social History Narrative   Patient lives at home alone . Patient is widowed.   Retired.  Education 8 th grade   Right handed.   Caffeine very little.      PHYSICAL EXAM  Vitals:   06/17/17 1110  BP: (!) 145/63  Pulse: 63  Weight: 226 lb 9.6 oz (102.8 kg)  Height: 6\' 1"  (1.854 m)   Body mass index is 29.08 kg/m.  Generalized: Well developed, in no acute distress   Neurological examination  Mentation: Alert oriented to time, place, history taking. Follows all commands speech and language fluent Cranial nerve II-XII: Pupils were equal round reactive to light. Extraocular movements were full, visual field were full on confrontational test. Facial sensation and strength were normal. Uvula tongue midline. Head turning and shoulder shrug  were normal and symmetric. Motor: The motor testing reveals 5 over 5 strength of all 4 extremities. Good symmetric motor tone is noted throughout.  Sensory: Sensory testing is intact to soft touch on all 4 extremities. No evidence of extinction is noted.  Coordination: Cerebellar testing reveals good finger-nose-finger and heel-to-shin bilaterally.  Gait and station: Gait is normal. Tandem gait is normal. Romberg is negative. No drift is seen.  Reflexes: Deep tendon reflexes are symmetric and normal bilaterally.   DIAGNOSTIC DATA (LABS, IMAGING, TESTING) - I reviewed patient records, labs, notes, testing and imaging myself where available.  Lab Results  Component Value Date   WBC 15.1 (H) 09/16/2014   HGB 10.8 (L) 09/16/2014   HCT 32.8 (L) 09/16/2014   MCV 88.2  09/16/2014   PLT 190 09/16/2014      Component Value Date/Time   NA 142 09/16/2014 0415   K 4.3 09/16/2014 0415   CL 107 09/16/2014 0415   CO2 28 09/16/2014 0415   GLUCOSE 163 (H) 09/16/2014 0415   BUN 20 09/16/2014 0415   CREATININE 1.03 09/16/2014 0415   CALCIUM 8.5 (L) 09/16/2014 0415   PROT 6.1 12/19/2016 0729   ALBUMIN 4.1 12/19/2016 0729   AST 23 12/19/2016 0729   ALT 18 12/19/2016 0729   ALKPHOS 66 12/19/2016 0729   BILITOT 0.7 12/19/2016 0729   GFRNONAA >60 09/16/2014 0415   GFRAA >60 09/16/2014 0415   Lab Results  Component Value Date   CHOL 109 12/19/2016   HDL 36 (L) 12/19/2016   LDLCALC 54 12/19/2016   TRIG 95 12/19/2016   CHOLHDL 3.0 12/19/2016   No results found for: HGBA1C Lab Results  Component Value Date   VITAMINB12 550 05/27/2013   Lab Results  Component Value Date   TSH 3.210 05/27/2013      ASSESSMENT AND PLAN 84 y.o. year old male  has a past medical history of Arthritis, CAD (coronary artery disease), Carotid artery disease (HCC), Dyslipidemia, Ejection fraction, GERD (gastroesophageal reflux disease), Hearing aid worn, Hiatal hernia, CABG, Hypertension, Medication intolerance, Myocardial infarction (HCC), Neuropathy, Peptic ulcer disease, Pneumonia, and Weight loss. here with:  1.  Paresthesia in the lower extremities  The patient will continue to wean off of gabapentin.  I have given him the following weaning schedule:  Week 1: 1/2 tablet (300 mg) AM, 1 tablet (600 mg) PM Week 2: 1/2 tablet twice a day Week 3: 1/3 tablet daily for 1 week Then stop medication  He was advised that if any point while he is weaning if he begins to have pain in the lower extremities he should let us know.  Patient voiced understanding.  He is advised that if his symptoms worsen or he develops new symptoms he should let us know.  He will follow-up if his symptoms  worsen or he develops new symptoms.  I spent 15 minutes with the patient. 50% of this time was  spent Discussing weaning schedule   Butch Penny, MSN, NP-C 06/17/2017, 11:17 AM [facility] [address], [address] [place], [place] [address] [phone]

## 2017-06-18 NOTE — Progress Notes (Signed)
I agree with the above plan 

## 2017-10-18 ENCOUNTER — Other Ambulatory Visit: Payer: Self-pay | Admitting: Physician Assistant

## 2017-10-20 MED ORDER — METOPROLOL SUCCINATE ER 50 MG PO TB24: 50.0000 mg | ORAL_TABLET | Freq: Every day | ORAL | 1 refills | Status: AC

## 2018-05-15 ENCOUNTER — Encounter: Payer: Self-pay | Admitting: Interventional Cardiology

## 2018-06-01 NOTE — Progress Notes (Signed)
Cardiology Office Note   Date:  06/02/2018   ID:  Jerry [name], DOB 24-Dec-1929, MRN [id number]  PCP:  Charlies Silvers, PA-C    No chief complaint on file.  CAD  Wt Readings from Last 3 Encounters:  06/02/18 223 lb (101.2 kg)  06/17/17 226 lb 9.6 oz (102.8 kg)  05/20/17 220 lb 6.4 oz (100 kg)       History of Present Illness: Jerry [name] is a 85 y.o. male  Who has had CAD.  He was followed by Dr. Myrtis Ser for many years. He had CABG in 2002. He has not had any PCI since the CABG. Stress tests were normal after the CABG in 2007, 2012. No palpitations. No chest pain. No NTG usage.  He had hip surgery in 2016 and did well.  No longer taking Xarelto. He is taking a baby aspirin without problems.   In the past, he was walking on the treadmill and did some light weights and bicycle.   He was diagnosed with the flu in early January 2019 and was treated with Tamiflu.   Since the last visit, he continues to exercise regularly with a treadmill and an exercise bike.    Denies : Chest pain. Dizziness. Leg edema. Nitroglycerin use. Orthopnea. Palpitations. Paroxysmal nocturnal dyspnea. Shortness of breath. Syncope.     Past Medical History:  Diagnosis Date  . Arthritis    knees and shoulders   . CAD (coronary artery disease)    no ischemia nuclear scan 12/2005 EF60% /  nuclear, October, 2012, no ischemia, EF normal  . Carotid artery disease (HCC)    Doppler, October, 2011, 40-59% bilateral  . Dyslipidemia    under control  . Ejection fraction    EF. 60%, Nuclear, 2007 /   EF, 60%, nuclear, October, 2012  . GERD (gastroesophageal reflux disease)   . Hearing aid worn    bilateral ears   . Hiatal hernia   . Hx of CABG    2002  . Hypertension   . Medication intolerance    NIACIN  . Myocardial infarction (HCC)    1996  . Neuropathy   . Peptic ulcer disease    "just one little ulcer"  . Pneumonia    hx of x1  . Weight loss    August, 2012    Past Surgical  History:  Procedure Laterality Date  . CARDIAC CATHETERIZATION    . CORONARY ARTERY BYPASS GRAFT     5  . HAND SURGERY Right ~1998  . JOINT REPLACEMENT Bilateral 2011, 2013   knees  . TOTAL HIP ARTHROPLASTY Right 09/14/2014   Procedure: RIGHT TOTAL HIP ARTHROPLASTY ANTERIOR APPROACH;  Surgeon: Ollen Gross, MD;  Location: [facility];  Service: Orthopedics;  Laterality: Right;  . TOTAL KNEE ARTHROPLASTY  05/13/2011   Procedure: TOTAL KNEE ARTHROPLASTY;  Surgeon: Loanne Drilling, MD;  Location: [facility];  Service: Orthopedics;  Laterality: Right;     Current Outpatient Medications  Medication Sig Dispense Refill  . amLODipine (NORVASC) 10 MG tablet TAKE ONE TABLET BY MOUTH ONCE DAILY. 90 tablet 2  . aspirin EC 81 MG tablet Take 1 tablet (81 mg total) by mouth daily.    Marland Kitchen atorvastatin (LIPITOR) 40 MG tablet Take 1 tablet (40 mg total) by mouth daily. 90 tablet 1  . fluticasone (FLONASE) 50 MCG/ACT nasal spray 1 spray.    . gabapentin (NEURONTIN) 600 MG tablet Take 600 mg by mouth 2 (two) times daily.    Marland Kitchen  metoprolol succinate (TOPROL-XL) 50 MG 24 hr tablet Take 1 tablet (50 mg total) by mouth daily. Take with or immediately following a meal. 90 tablet 1  . nitroGLYCERIN (NITROSTAT) 0.4 MG SL tablet Place 1 tablet (0.4 mg total) under the tongue every 5 (five) minutes as needed for chest pain. 25 tablet 3  . polyethylene glycol (MIRALAX / GLYCOLAX) packet Take 17 g by mouth daily as needed for mild constipation. 14 each 0  . tiZANidine (ZANAFLEX) 2 MG tablet Take 2 mg by mouth daily.    . ranitidine (ZANTAC) 300 MG capsule Take 300 mg by mouth daily.      No current facility-administered medications for this visit.     Allergies:   Duloxetine and Niacin    Social History:  The patient  reports that he quit smoking about 24 years ago. His smoking use included cigarettes and cigars. He has never used smokeless tobacco. He reports that he does not drink alcohol or use drugs.   Family History:   The patient's family history includes Stroke in his father and mother.    ROS:  Please see the history of present illness.   Otherwise, review of systems are positive for "eating too much".   All other systems are reviewed and negative.    PHYSICAL EXAM: VS:  BP (!) 126/50   Pulse 67   Ht 6\' 1"  (1.854 m)   Wt 223 lb (101.2 kg)   SpO2 97%   BMI 29.42 kg/m  , BMI Body mass index is 29.42 kg/m. GEN: Well nourished, well developed, in no acute distress  HEENT: normal  Neck: no JVD, carotid bruits, or masses Cardiac: RRR; no murmurs, rubs, or gallops,no edema  Respiratory:  clear to auscultation bilaterally, normal work of breathing GI: soft, nontender, nondistended, + BS MS: no deformity or atrophy  Skin: warm and dry, no rash Neuro:  Strength and sensation are intact Psych: euthymic mood, full affect   EKG:   The ekg ordered today demonstrates NSR, nonspecific ST changes, PVCs   Recent Labs: No results found for requested labs within last 8760 hours.   Lipid Panel    Component Value Date/Time   CHOL 109 12/19/2016 0728   TRIG 95 12/19/2016 0728   HDL 36 (L) 12/19/2016 0728   CHOLHDL 3.0 12/19/2016 0728   CHOLHDL 3 01/07/2011 0918   VLDL 17.2 01/07/2011 0918   LDLCALC 54 12/19/2016 0728     Other studies Reviewed: Additional studies/ records that were reviewed today with results demonstrating: labs from 9/19: TC 111, LDL 52, HDL 36.   ASSESSMENT AND PLAN:  1. CAD: No angina.  Continue aggressive secondary prevention.  Healthy diet recommended. 2. HTN: The current medical regimen is effective;  continue present plan and medications. 3. Hyperlipidemia: The current medical regimen is effective;  continue present plan and medications. 4. Sinus arrhythmia in the past. No AFib today.  Only PVCs as cause of irregularlity.  5. Carotid artery disease: Last carotid DOppler in 4./18 showed mild disease.  Will repeat.    Current medicines are reviewed at length with the  patient today.  The patient concerns regarding his medicines were addressed.  The following changes have been made:  No change  Labs/ tests ordered today include:  No orders of the defined types were placed in this encounter.   Recommend 150 minutes/week of aerobic exercise Low fat, low carb, high fiber diet recommended  Disposition:   FU in 1 year   Signed,  Lance Muss, MD  06/02/2018 8:13 AM    [facility] [address] [place], [place], [place]  [address] Phone: [phone]; Fax: [phone]

## 2018-06-02 ENCOUNTER — Encounter: Payer: Self-pay | Admitting: Interventional Cardiology

## 2018-06-02 ENCOUNTER — Ambulatory Visit (INDEPENDENT_AMBULATORY_CARE_PROVIDER_SITE_OTHER): Payer: Medicare Other | Admitting: Interventional Cardiology

## 2018-06-02 ENCOUNTER — Encounter (INDEPENDENT_AMBULATORY_CARE_PROVIDER_SITE_OTHER): Payer: Self-pay

## 2018-06-02 VITALS — BP 126/50 | HR 67 | Ht 73.0 in | Wt 223.0 lb

## 2018-06-02 DIAGNOSIS — I10 Essential (primary) hypertension: Secondary | ICD-10-CM

## 2018-06-02 DIAGNOSIS — Z951 Presence of aortocoronary bypass graft: Secondary | ICD-10-CM

## 2018-06-02 DIAGNOSIS — I6523 Occlusion and stenosis of bilateral carotid arteries: Secondary | ICD-10-CM | POA: Diagnosis not present

## 2018-06-02 DIAGNOSIS — E7849 Other hyperlipidemia: Secondary | ICD-10-CM

## 2018-06-02 DIAGNOSIS — I2510 Atherosclerotic heart disease of native coronary artery without angina pectoris: Secondary | ICD-10-CM | POA: Diagnosis not present

## 2018-06-02 NOTE — Patient Instructions (Signed)
Medication Instructions:  Your physician recommends that you continue on your current medications as directed. Please refer to the Current Medication list given to you today.  If you need a refill on your cardiac medications before your next appointment, please call your pharmacy.   Lab work: None Ordered  If you have labs (blood work) drawn today and your tests are completely normal, you will receive your results only by: . MyChart Message (if you have MyChart) OR . A paper copy in the mail If you have any lab test that is abnormal or we need to change your treatment, we will call you to review the results.  Testing/Procedures: Your physician has requested that you have a carotid duplex. This test is an ultrasound of the carotid arteries in your neck. It looks at blood flow through these arteries that supply the brain with blood. Allow one hour for this exam. There are no restrictions or special instructions.  Follow-Up: At [facility], you and your health needs are our priority.  As part of our continuing mission to provide you with exceptional heart care, we have created designated Provider Care Teams.  These Care Teams include your primary Cardiologist (physician) and Advanced Practice Providers (APPs -  Physician Assistants and Nurse Practitioners) who all work together to provide you with the care you need, when you need it. . You will need a follow up appointment in 1 year.  Please call our office 2 months in advance to schedule this appointment.  You may see Jay Varanasi, MD or one of the following Advanced Practice Providers on your designated Care Team:   . Brittainy Simmons, PA-C . Dayna Dunn, PA-C . Michele Lenze, PA-C  Any Other Special Instructions Will Be Listed Below (If Applicable).    

## 2018-06-03 ENCOUNTER — Ambulatory Visit (HOSPITAL_COMMUNITY)
Admission: RE | Admit: 2018-06-03 | Discharge: 2018-06-03 | Disposition: A | Discharge status: Home / Self Care | Payer: Medicare Other | Source: Ambulatory Visit | Attending: Cardiology | Admitting: Cardiology

## 2018-06-03 DIAGNOSIS — I6523 Occlusion and stenosis of bilateral carotid arteries: Secondary | ICD-10-CM

## 2018-06-04 ENCOUNTER — Telehealth: Payer: Self-pay

## 2018-06-04 DIAGNOSIS — I6523 Occlusion and stenosis of bilateral carotid arteries: Secondary | ICD-10-CM

## 2018-06-04 NOTE — Telephone Encounter (Signed)
The patient has been notified of the result and verbalized understanding.  All questions (if any) were answered. Repeat study ordered for 1 year. Lattie Haw, RN 06/04/2018 1:52 PM

## 2018-06-04 NOTE — Telephone Encounter (Signed)
-----   Message from Corky Crafts, MD sent at 06/04/2018 11:05 AM EST ----- Moderate left carotid disease.  Repeat study in 1 year.

## 2019-05-31 NOTE — Progress Notes (Signed)
Cardiology Office Note   Date:  06/02/2019   ID:  Jerry [name], DOB [birth year], MRN [id number]  PCP:  Charlies Silvers, PA-C    No chief complaint on file.  CAD  Wt Readings from Last 3 Encounters:  06/02/19 229 lb 12.8 oz (104.2 kg)  06/02/18 223 lb (101.2 kg)  06/17/17 226 lb 9.6 oz (102.8 kg)       History of Present Illness: Jerry [name] is a 86 y.o. male  Who has had CAD.He was followed by Dr. Myrtis Ser for many years.He had CABG in 2002. He has not had any PCI since the CABG. Stress tests were normal after the CABG in 2007, 2012. No palpitations. No chest pain. No NTG usage.  He had hip surgery in 2016 and did well. No longer taking Xarelto. He is taking a baby aspirin without problems.  In the past, he waswalking onthe treadmill and didsome light weights and bicycle.   He was diagnosed with the flu in early January 2019 and was treated with Tamiflu.  Since the last visit, he has stayed out of crowds.  He exercises daily with a bike and treadmill, 20 minutes.  Denies : Chest pain. Dizziness. Leg edema. Nitroglycerin use. Orthopnea. Palpitations. Paroxysmal nocturnal dyspnea. Shortness of breath. Syncope.     Past Medical History:  Diagnosis Date  . Arthritis    knees and shoulders   . CAD (coronary artery disease)    no ischemia nuclear scan 12/2005 EF60% /  nuclear, October, 2012, no ischemia, EF normal  . Carotid artery disease (HCC)    Doppler, October, 2011, 40-59% bilateral  . Dyslipidemia    under control  . Ejection fraction    EF. 60%, Nuclear, 2007 /   EF, 60%, nuclear, October, 2012  . GERD (gastroesophageal reflux disease)   . Hearing aid worn    bilateral ears   . Hiatal hernia   . Hx of CABG    2002  . Hypertension   . Medication intolerance    NIACIN  . Myocardial infarction (HCC)    1996  . Neuropathy   . Peptic ulcer disease    "just one little ulcer"  . Pneumonia    hx of x1  . Weight loss    August, 2012     Past Surgical History:  Procedure Laterality Date  . CARDIAC CATHETERIZATION    . CORONARY ARTERY BYPASS GRAFT     5  . HAND SURGERY Right ~1998  . JOINT REPLACEMENT Bilateral 2011, 2013   knees  . TOTAL HIP ARTHROPLASTY Right 09/14/2014   Procedure: RIGHT TOTAL HIP ARTHROPLASTY ANTERIOR APPROACH;  Surgeon: Ollen Gross, MD;  Location: [facility];  Service: Orthopedics;  Laterality: Right;  . TOTAL KNEE ARTHROPLASTY  05/13/2011   Procedure: TOTAL KNEE ARTHROPLASTY;  Surgeon: Loanne Drilling, MD;  Location: [facility];  Service: Orthopedics;  Laterality: Right;     Current Outpatient Medications  Medication Sig Dispense Refill  . amLODipine (NORVASC) 10 MG tablet TAKE ONE TABLET BY MOUTH ONCE DAILY. 90 tablet 2  . aspirin EC 81 MG tablet Take 1 tablet (81 mg total) by mouth daily.    Marland Kitchen atorvastatin (LIPITOR) 40 MG tablet Take 1 tablet (40 mg total) by mouth daily. 90 tablet 1  . fluticasone (FLONASE) 50 MCG/ACT nasal spray 1 spray.    . gabapentin (NEURONTIN) 600 MG tablet Take 600 mg by mouth 2 (two) times daily.    . metoprolol succinate (TOPROL-XL) 50  MG 24 hr tablet Take 1 tablet (50 mg total) by mouth daily. Take with or immediately following a meal. 90 tablet 1  . nitroGLYCERIN (NITROSTAT) 0.4 MG SL tablet Place 1 tablet (0.4 mg total) under the tongue every 5 (five) minutes as needed for chest pain. 25 tablet 3  . polyethylene glycol (MIRALAX / GLYCOLAX) packet Take 17 g by mouth daily as needed for mild constipation. 14 each 0  . ranitidine (ZANTAC) 300 MG capsule Take 300 mg by mouth daily.     Marland Kitchen tiZANidine (ZANAFLEX) 2 MG tablet Take 2 mg by mouth daily.     No current facility-administered medications for this visit.    Allergies:   Duloxetine and Niacin    Social History:  The patient  reports that he quit smoking about 25 years ago. His smoking use included cigarettes and cigars. He has never used smokeless tobacco. He reports that he does not drink alcohol or use drugs.    Family History:  The patient's family history includes Stroke in his father and mother.    ROS:  Please see the history of present illness.   Otherwise, review of systems are positive for mild weight gain.   All other systems are reviewed and negative.    PHYSICAL EXAM: VS:  BP 132/60   Pulse 60   Ht 6\' 1"  (1.854 m)   Wt 229 lb 12.8 oz (104.2 kg)   SpO2 94%   BMI 30.32 kg/m  , BMI Body mass index is 30.32 kg/m. GEN: Well nourished, well developed, in no acute distress  HEENT: normal  Neck: no JVD, carotid bruits, or masses Cardiac: RRR; no murmurs, rubs, or gallops,no edema  Respiratory:  clear to auscultation bilaterally, normal work of breathing GI: soft, nontender, nondistended, + BS MS: no deformity or atrophy  Skin: warm and dry, no rash Neuro:  Strength and sensation are intact Psych: euthymic mood, full affect   EKG:   The ekg ordered today demonstrates NSR, PVCs, mild anterior T wave inversions   Recent Labs: No results found for requested labs within last 8760 hours.   Lipid Panel    Component Value Date/Time   CHOL 109 12/19/2016 0728   TRIG 95 12/19/2016 0728   HDL 36 (L) 12/19/2016 0728   CHOLHDL 3.0 12/19/2016 0728   CHOLHDL 3 01/07/2011 0918   VLDL 17.2 01/07/2011 0918   LDLCALC 54 12/19/2016 0728     Other studies Reviewed: Additional studies/ records that were reviewed today with results demonstrating: 2018 labs reviewed.   ASSESSMENT AND PLAN:  1. CAD: s/p CABG. No angina on medical therapy.  Continue aggressive secondary prevention.  2. HTN: The current medical regimen is effective;  continue present plan and medications.  Low-salt diet recommended.  Whole food, plant-based diet recommended.  Continue regular exercise.  Can change to the target of 5 days a week, 30 minutes a day exercise to give his joints of break. 3. Carotid artery stenosis:  Repeat study scheduled for 06/04/19. 4. Hyperlipidemia: Repeat labs when fasting. Continue statin.     Current medicines are reviewed at length with the patient today.  The patient concerns regarding his medicines were addressed.  The following changes have been made:  No change  Labs/ tests ordered today include:  No orders of the defined types were placed in this encounter.   Recommend 150 minutes/week of aerobic exercise Low fat, low carb, high fiber diet recommended  Disposition:   FU in 1 year  Signed, Larae Grooms, MD  06/02/2019 9:59 AM    [facility] [address], [place], [place]  [address] Phone: [phone]; Fax: [phone]

## 2019-06-02 ENCOUNTER — Ambulatory Visit (INDEPENDENT_AMBULATORY_CARE_PROVIDER_SITE_OTHER): Payer: Medicare Other | Admitting: Interventional Cardiology

## 2019-06-02 ENCOUNTER — Encounter: Payer: Self-pay | Admitting: Interventional Cardiology

## 2019-06-02 ENCOUNTER — Other Ambulatory Visit: Payer: Self-pay

## 2019-06-02 VITALS — BP 132/60 | HR 60 | Ht 73.0 in | Wt 229.8 lb

## 2019-06-02 DIAGNOSIS — Z951 Presence of aortocoronary bypass graft: Secondary | ICD-10-CM

## 2019-06-02 DIAGNOSIS — I6523 Occlusion and stenosis of bilateral carotid arteries: Secondary | ICD-10-CM | POA: Diagnosis not present

## 2019-06-02 DIAGNOSIS — I10 Essential (primary) hypertension: Secondary | ICD-10-CM | POA: Diagnosis not present

## 2019-06-02 DIAGNOSIS — E782 Mixed hyperlipidemia: Secondary | ICD-10-CM

## 2019-06-02 DIAGNOSIS — I25118 Atherosclerotic heart disease of native coronary artery with other forms of angina pectoris: Secondary | ICD-10-CM | POA: Diagnosis not present

## 2019-06-02 MED ORDER — NITROGLYCERIN 0.4 MG SL SUBL: 0.4000 mg | SUBLINGUAL_TABLET | SUBLINGUAL | 3 refills | Status: AC | PRN

## 2019-06-02 NOTE — Patient Instructions (Addendum)
Medication Instructions:  Your physician recommends that you continue on your current medications as directed. Please refer to the Current Medication list given to you today.  *If you need a refill on your cardiac medications before your next appointment, please call your pharmacy*  Lab Work: Your physician recommends that you return for a FASTING LIPIDS, CBC, CMET  If you have labs (blood work) drawn today and your tests are completely normal, you will receive your results only by: Marland Kitchen MyChart Message (if you have MyChart) OR . A paper copy in the mail If you have any lab test that is abnormal or we need to change your treatment, we will call you to review the results.  Testing/Procedures: None ordered  Follow-Up: At [facility], you and your health needs are our priority.  As part of our continuing mission to provide you with exceptional heart care, we have created designated Provider Care Teams.  These Care Teams include your primary Cardiologist (physician) and Advanced Practice Providers (APPs -  Physician Assistants and Nurse Practitioners) who all work together to provide you with the care you need, when you need it.  Your next appointment:   12 month(s)  The format for your next appointment:   In Person  Provider:   You may see Lance Muss, MD or one of the following Advanced Practice Providers on your designated Care Team:    Ronie Spies, PA-C  Jacolyn Reedy, PA-C   Other Instructions  We are recommending the COVID-19 vaccine to all of our patients. Cardiac medications (including blood thinners) should not deter anyone from being vaccinated and there is no need to hold any of those medications prior to vaccine administration.   Currently, there is a hotline to call (active 05/07/19) to schedule vaccination appointments as no walk-ins will be accepted.    Vaccines through the health department can be arranged by calling [phone]    Vaccines through [facility] can be  arranged by calling [phone] or visiting [email or web address]   If you have further questions or concerns about the vaccine process, please visit [email or web address], [email or web address], or contact your primary care physician.

## 2019-06-03 ENCOUNTER — Other Ambulatory Visit: Payer: Medicare Other | Admitting: *Deleted

## 2019-06-03 DIAGNOSIS — I25118 Atherosclerotic heart disease of native coronary artery with other forms of angina pectoris: Secondary | ICD-10-CM

## 2019-06-03 DIAGNOSIS — I10 Essential (primary) hypertension: Secondary | ICD-10-CM

## 2019-06-03 DIAGNOSIS — Z951 Presence of aortocoronary bypass graft: Secondary | ICD-10-CM

## 2019-06-03 DIAGNOSIS — E782 Mixed hyperlipidemia: Secondary | ICD-10-CM

## 2019-06-03 DIAGNOSIS — I6523 Occlusion and stenosis of bilateral carotid arteries: Secondary | ICD-10-CM

## 2019-06-03 LAB — CBC
Hematocrit: 40.9 % (ref 37.5–51.0)
Hemoglobin: 14.1 g/dL (ref 13.0–17.7)
MCH: 31.3 pg (ref 26.6–33.0)
MCHC: 34.5 g/dL (ref 31.5–35.7)
MCV: 91 fL (ref 79–97)
Platelets: 188 10*3/uL (ref 150–450)
RBC: 4.5 x10E6/uL (ref 4.14–5.80)
RDW: 12.7 % (ref 11.6–15.4)
WBC: 5.6 10*3/uL (ref 3.4–10.8)

## 2019-06-03 LAB — LIPID PANEL
Chol/HDL Ratio: 3.3 ratio (ref 0.0–5.0)
Cholesterol, Total: 127 mg/dL (ref 100–199)
HDL: 39 mg/dL — ABNORMAL LOW (ref >39)
LDL Chol Calc (NIH): 67 mg/dL (ref 0–99)
Triglycerides: 116 mg/dL (ref 0–149)
VLDL Cholesterol Cal: 21 mg/dL (ref 5–40)

## 2019-06-03 LAB — COMPREHENSIVE METABOLIC PANEL
ALT: 30 IU/L (ref 0–44)
AST: 30 IU/L (ref 0–40)
Albumin/Globulin Ratio: 1.9 (ref 1.2–2.2)
Albumin: 4.3 g/dL (ref 3.6–4.6)
Alkaline Phosphatase: 101 IU/L (ref 39–117)
BUN/Creatinine Ratio: 17 (ref 10–24)
BUN: 21 mg/dL (ref 8–27)
Bilirubin Total: 1.1 mg/dL (ref 0.0–1.2)
CO2: 24 mmol/L (ref 20–29)
Calcium: 8.9 mg/dL (ref 8.6–10.2)
Chloride: 104 mmol/L (ref 96–106)
Creatinine, Ser: 1.22 mg/dL (ref 0.76–1.27)
GFR calc Af Amer: 61 mL/min/{1.73_m2} (ref >59)
GFR calc non Af Amer: 53 mL/min/{1.73_m2} — ABNORMAL LOW (ref >59)
Globulin, Total: 2.3 g/dL (ref 1.5–4.5)
Glucose: 144 mg/dL — ABNORMAL HIGH (ref 65–99)
Potassium: 4.1 mmol/L (ref 3.5–5.2)
Sodium: 142 mmol/L (ref 134–144)
Total Protein: 6.6 g/dL (ref 6.0–8.5)

## 2019-06-04 ENCOUNTER — Other Ambulatory Visit: Payer: Self-pay | Admitting: Interventional Cardiology

## 2019-06-04 ENCOUNTER — Ambulatory Visit (HOSPITAL_COMMUNITY)
Admission: RE | Admit: 2019-06-04 | Discharge: 2019-06-04 | Disposition: A | Discharge status: Home / Self Care | Payer: Medicare Other | Source: Ambulatory Visit | Attending: Cardiovascular Disease | Admitting: Cardiovascular Disease

## 2019-06-04 ENCOUNTER — Other Ambulatory Visit: Payer: Self-pay

## 2019-06-04 DIAGNOSIS — I6523 Occlusion and stenosis of bilateral carotid arteries: Secondary | ICD-10-CM

## 2019-06-07 ENCOUNTER — Telehealth: Payer: Self-pay | Admitting: *Deleted

## 2019-06-07 DIAGNOSIS — I6523 Occlusion and stenosis of bilateral carotid arteries: Secondary | ICD-10-CM

## 2019-06-07 NOTE — Telephone Encounter (Signed)
-----   Message from Corky Crafts, MD sent at 06/04/2019  5:25 PM EST ----- Calcific plaque noted without significant ICA disease.  Repeat study in 1 year

## 2019-06-07 NOTE — Telephone Encounter (Signed)
Pt has been notified of carotid results by phone with verbal understanding. Pt is agreeable to repeat carotids in 1 yr. Order has been placed though no appt has been made. Patient notified of result.  Please refer to phone note from today for complete details.   Danielle Rankin, [facility] 06/07/2019 3:09 PM

## 2020-05-27 NOTE — Progress Notes (Signed)
Cardiology Office Note   Date:  05/29/2020   ID:  Jerry [name], DOB 03/28/1930, MRN [id number]  PCP:  Charlies Silvers, PA-C    No chief complaint on file.  CAD  Wt Readings from Last 3 Encounters:  05/29/20 239 lb 9.6 oz (108.7 kg)  06/02/19 229 lb 12.8 oz (104.2 kg)  06/02/18 223 lb (101.2 kg)       History of Present Illness: Jerry [name] is a 87 y.o. male   Who has had CAD.He was followed by Dr. Myrtis Ser for many years.He had CABG in 2002. He has not had any PCI since the CABG. Stress tests were normal after the CABG in 2007, 2012. No palpitations. No chest pain. No NTG usage.  He had hip surgery in 2016 and did well. No longer taking Xarelto. He is taking a baby aspirin without problems.  In the past, hewaswalking onthe treadmill and didsome light weights and bicycle.   He was diagnosed with the flu in early January2019and was treated with Tamiflu.  Since the last visit, he has not been exercising, due to "laziness."  Denies : Chest pain. Dizziness. Leg edema. Nitroglycerin use. Orthopnea. Palpitations. Paroxysmal nocturnal dyspnea. Shortness of breath. Syncope.   Has some low back pain.   Lives alone.  He does most of the cooking since his wife passed away.  Kids live near by.    Past Medical History:  Diagnosis Date  . Arthritis    knees and shoulders   . CAD (coronary artery disease)    no ischemia nuclear scan 12/2005 EF60% /  nuclear, October, 2012, no ischemia, EF normal  . Carotid artery disease (HCC)    Doppler, October, 2011, 40-59% bilateral  . Dyslipidemia    under control  . Ejection fraction    EF. 60%, Nuclear, 2007 /   EF, 60%, nuclear, October, 2012  . GERD (gastroesophageal reflux disease)   . Hearing aid worn    bilateral ears   . Hiatal hernia   . Hx of CABG    2002  . Hypertension   . Medication intolerance    NIACIN  . Myocardial infarction (HCC)    1996  . Neuropathy   . Peptic ulcer disease    "just one  little ulcer"  . Pneumonia    hx of x1  . Weight loss    August, 2012    Past Surgical History:  Procedure Laterality Date  . CARDIAC CATHETERIZATION    . CORONARY ARTERY BYPASS GRAFT     5  . HAND SURGERY Right ~1998  . JOINT REPLACEMENT Bilateral 2011, 2013   knees  . TOTAL HIP ARTHROPLASTY Right 09/14/2014   Procedure: RIGHT TOTAL HIP ARTHROPLASTY ANTERIOR APPROACH;  Surgeon: Ollen Gross, MD;  Location: [facility];  Service: Orthopedics;  Laterality: Right;  . TOTAL KNEE ARTHROPLASTY  05/13/2011   Procedure: TOTAL KNEE ARTHROPLASTY;  Surgeon: Loanne Drilling, MD;  Location: [facility];  Service: Orthopedics;  Laterality: Right;     Current Outpatient Medications  Medication Sig Dispense Refill  . amLODipine (NORVASC) 10 MG tablet TAKE ONE TABLET BY MOUTH ONCE DAILY. 90 tablet 2  . aspirin EC 81 MG tablet Take 1 tablet (81 mg total) by mouth daily.    . fluticasone (FLONASE) 50 MCG/ACT nasal spray 1 spray.    . gabapentin (NEURONTIN) 600 MG tablet Take 600 mg by mouth 2 (two) times daily.    . metoprolol succinate (TOPROL-XL) 50 MG 24 hr  tablet Take 1 tablet (50 mg total) by mouth daily. Take with or immediately following a meal. 90 tablet 1  . polyethylene glycol (MIRALAX / GLYCOLAX) packet Take 17 g by mouth daily as needed for mild constipation. 14 each 0  . ranitidine (ZANTAC) 300 MG capsule Take 300 mg by mouth daily.    Marland Kitchen tiZANidine (ZANAFLEX) 2 MG tablet Take 2 mg by mouth daily.    Marland Kitchen atorvastatin (LIPITOR) 40 MG tablet Take 1 tablet (40 mg total) by mouth daily. 90 tablet 1  . nitroGLYCERIN (NITROSTAT) 0.4 MG SL tablet Place 1 tablet (0.4 mg total) under the tongue every 5 (five) minutes as needed for chest pain. 25 tablet 3   No current facility-administered medications for this visit.    Allergies:   Duloxetine and Niacin    Social History:  The patient  reports that he quit smoking about 26 years ago. His smoking use included cigarettes and cigars. He has never used  smokeless tobacco. He reports that he does not drink alcohol and does not use drugs.   Family History:  The patient's family history includes Stroke in his father and mother.    ROS:  Please see the history of present illness.   Otherwise, review of systems are positive for weight gain, no falls.   All other systems are reviewed and negative.    PHYSICAL EXAM: VS:  BP 122/60   Pulse 67   Ht 6\' 1"  (1.854 m)   Wt 239 lb 9.6 oz (108.7 kg)   SpO2 95%   BMI 31.61 kg/m  , BMI Body mass index is 31.61 kg/m. GEN: Well nourished, well developed, in no acute distress  HEENT: normal  Neck: no JVD, carotid bruits, or masses Cardiac: RRR, premature beats; no murmurs, rubs, or gallops,no edema  Respiratory:  clear to auscultation bilaterally, normal work of breathing GI: soft, nontender, nondistended, + BS MS: no deformity or atrophy  Skin: warm and dry, no rash Neuro:  Strength and sensation are intact Psych: euthymic mood, full affect   EKG:   The ekg ordered today demonstrates NSR, frequent PACs.    Recent Labs: 06/03/2019: ALT 30; BUN 21; Creatinine, Ser 1.22; Hemoglobin 14.1; Platelets 188; Potassium 4.1; Sodium 142   Lipid Panel    Component Value Date/Time   CHOL 127 06/03/2019 0819   TRIG 116 06/03/2019 0819   HDL 39 (L) 06/03/2019 0819   CHOLHDL 3.3 06/03/2019 0819   CHOLHDL 3 01/07/2011 0918   VLDL 17.2 01/07/2011 0918   LDLCALC 67 06/03/2019 0819     Other studies Reviewed: Additional studies/ records that were reviewed today with results demonstrating: labs reviewed.   ASSESSMENT AND PLAN:  1. CAD: prior CABG.  No angina.  The current medical regimen is effective;  continue present plan and medications. 2. HTN: The current medical regimen is effective;  continue present plan and medications.  Low salt diet.  Avoid processed foods.  Increase walking safely.   3. Carotid artery stenosis: Mild disease.  Needs recheck in 2022. 4. Hyperlipidemia: LDL 88 in 11/21 from  62 in 4/21. Increase atorvastatin to 80 mg daily.  Check , liver and lipids in April 2022.  Want to see LDL < 70.    Current medicines are reviewed at length with the patient today.  The patient concerns regarding his medicines were addressed.  The following changes have been made:  No change  Labs/ tests ordered today include:  No orders of the defined types  were placed in this encounter.   Recommend 150 minutes/week of aerobic exercise Low fat, low carb, high fiber diet recommended  Disposition:   FU in 1 year   Signed, Lance Muss, MD  05/29/2020 9:09 AM    [facility] [address] [place], [place], [place]  [address] Phone: [phone]; Fax: [phone]

## 2020-05-29 ENCOUNTER — Other Ambulatory Visit: Payer: Self-pay

## 2020-05-29 ENCOUNTER — Ambulatory Visit (INDEPENDENT_AMBULATORY_CARE_PROVIDER_SITE_OTHER): Payer: Medicare Other | Admitting: Interventional Cardiology

## 2020-05-29 ENCOUNTER — Encounter: Payer: Self-pay | Admitting: Interventional Cardiology

## 2020-05-29 VITALS — BP 122/60 | HR 67 | Ht 73.0 in | Wt 239.6 lb

## 2020-05-29 DIAGNOSIS — I25118 Atherosclerotic heart disease of native coronary artery with other forms of angina pectoris: Secondary | ICD-10-CM

## 2020-05-29 DIAGNOSIS — E782 Mixed hyperlipidemia: Secondary | ICD-10-CM

## 2020-05-29 DIAGNOSIS — Z951 Presence of aortocoronary bypass graft: Secondary | ICD-10-CM | POA: Diagnosis not present

## 2020-05-29 DIAGNOSIS — I10 Essential (primary) hypertension: Secondary | ICD-10-CM | POA: Diagnosis not present

## 2020-05-29 DIAGNOSIS — I6523 Occlusion and stenosis of bilateral carotid arteries: Secondary | ICD-10-CM

## 2020-05-29 MED ORDER — ATORVASTATIN CALCIUM 80 MG PO TABS: 80.0000 mg | ORAL_TABLET | Freq: Every day | ORAL | 3 refills | Status: AC

## 2020-05-29 NOTE — Patient Instructions (Signed)
Medication Instructions:  Your physician has recommended you make the following change in your medication:   INCREASE: atorvastatin (lipitor) to 80 mg once a day  *If you need a refill on your cardiac medications before your next appointment, please call your pharmacy*   Lab Work: Your physician recommends that you return for a FASTING lipid profile and liver function panel in 3 months  If you have labs (blood work) drawn today and your tests are completely normal, you will receive your results only by: [place] MyChart Message (if you have MyChart) OR . A paper copy in the mail If you have any lab test that is abnormal or we need to change your treatment, we will call you to review the results.   Testing/Procedures: Keep appointment for carotid ultrasound on 06/06/20 at 11:00 AM   Follow-Up: At [facility], you and your health needs are our priority.  As part of our continuing mission to provide you with exceptional heart care, we have created designated Provider Care Teams.  These Care Teams include your primary Cardiologist (physician) and Advanced Practice Providers (APPs -  Physician Assistants and Nurse Practitioners) who all work together to provide you with the care you need, when you need it.  We recommend signing up for the patient portal called "MyChart".  Sign up information is provided on this After Visit Summary.  MyChart is used to connect with patients for Virtual Visits (Telemedicine).  Patients are able to view lab/test results, encounter notes, upcoming appointments, etc.  Non-urgent messages can be sent to your provider as well.   To learn more about what you can do with MyChart, go to [email or web address].    Your next appointment:   12 month(s)  The format for your next appointment:   In Person  Provider:   You may see Lance Muss, MD or one of the following Advanced Practice Providers on your designated Care Team:    Ronie Spies, PA-C  Jacolyn Reedy,  PA-C    Other Instructions  High-Fiber Eating Plan Fiber, also called dietary fiber, is a type of carbohydrate. It is found foods such as fruits, vegetables, whole grains, and beans. A high-fiber diet can have many health benefits. Your health care provider may recommend a high-fiber diet to help:  Prevent constipation. Fiber can make your bowel movements more regular.  Lower your cholesterol.  Relieve the following conditions: ? Inflammation of veins in the anus (hemorrhoids). ? Inflammation of specific areas of the digestive tract (uncomplicated diverticulosis). ? A problem of the large intestine, also called the colon, that sometimes causes pain and diarrhea (irritable bowel syndrome, or IBS).  Prevent overeating as part of a weight-loss plan.  Prevent heart disease, type 2 diabetes, and certain cancers. What are tips for following this plan? Reading food labels  Check the nutrition facts label on food products for the amount of dietary fiber. Choose foods that have 5 grams of fiber or more per serving.  The goals for recommended daily fiber intake include: ? Men (age 53 or younger): 34-38 g. ? Men (over age 73): 28-34 g. ? Women (age 12 or younger): 25-28 g. ? Women (over age 48): 22-25 g. Your daily fiber goal is _____________ g.   Shopping  Choose whole fruits and vegetables instead of processed forms, such as apple juice or applesauce.  Choose a wide variety of high-fiber foods such as avocados, lentils, oats, and kidney beans.  Read the nutrition facts label of the foods you choose. Be  aware of foods with added fiber. These foods often have high sugar and sodium amounts per serving. Cooking  Use whole-grain flour for baking and cooking.  Cook with brown rice instead of white rice. Meal planning  Start the day with a breakfast that is high in fiber, such as a cereal that contains 5 g of fiber or more per serving.  Eat breads and cereals that are made with  whole-grain flour instead of refined flour or white flour.  Eat brown rice, bulgur wheat, or millet instead of white rice.  Use beans in place of meat in soups, salads, and pasta dishes.  Be sure that half of the grains you eat each day are whole grains. General information  You can get the recommended daily intake of dietary fiber by: ? Eating a variety of fruits, vegetables, grains, nuts, and beans. ? Taking a fiber supplement if you are not able to take in enough fiber in your diet. It is better to get fiber through food than from a supplement.  Gradually increase how much fiber you consume. If you increase your intake of dietary fiber too quickly, you may have bloating, cramping, or gas.  Drink plenty of water to help you digest fiber.  Choose high-fiber snacks, such as berries, raw vegetables, nuts, and popcorn. What foods should I eat? Fruits Berries. Pears. Apples. Oranges. Avocado. Prunes and raisins. Dried figs. Vegetables Sweet potatoes. Spinach. Kale. Artichokes. Cabbage. Broccoli. Cauliflower. Green peas. Carrots. Squash. Grains Whole-grain breads. Multigrain cereal. Oats and oatmeal. Brown rice. Barley. Bulgur wheat. Millet. Quinoa. Bran muffins. Popcorn. Rye wafer crackers. Meats and other proteins Navy beans, kidney beans, and pinto beans. Soybeans. Split peas. Lentils. Nuts and seeds. Dairy Fiber-fortified yogurt. Beverages Fiber-fortified soy milk. Fiber-fortified orange juice. Other foods Fiber bars. The items listed above may not be a complete list of recommended foods and beverages. Contact a dietitian for more information. What foods should I avoid? Fruits Fruit juice. Cooked, strained fruit. Vegetables Fried potatoes. Canned vegetables. Well-cooked vegetables. Grains White bread. Pasta made with refined flour. White rice. Meats and other proteins Fatty cuts of meat. Fried chicken or fried fish. Dairy Milk. Yogurt. Cream cheese. Sour cream. Fats and  oils Butters. Beverages Soft drinks. Other foods Cakes and pastries. The items listed above may not be a complete list of foods and beverages to avoid. Talk with your dietitian about what choices are best for you. Summary  Fiber is a type of carbohydrate. It is found in foods such as fruits, vegetables, whole grains, and beans.  A high-fiber diet has many benefits. It can help to prevent constipation, lower blood cholesterol, aid weight loss, and reduce your risk of heart disease, diabetes, and certain cancers.  Increase your intake of fiber gradually. Increasing fiber too quickly may cause cramping, bloating, and gas. Drink plenty of water while you increase the amount of fiber you consume.  The best sources of fiber include whole fruits and vegetables, whole grains, nuts, seeds, and beans. This information is not intended to replace advice given to you by your health care provider. Make sure you discuss any questions you have with your health care provider. Document Revised: 08/19/2019 Document Reviewed: 08/19/2019 Elsevier Patient Education  2021 [employer].

## 2020-06-06 ENCOUNTER — Other Ambulatory Visit: Payer: Self-pay

## 2020-06-06 ENCOUNTER — Other Ambulatory Visit (HOSPITAL_COMMUNITY): Payer: Self-pay | Admitting: Interventional Cardiology

## 2020-06-06 ENCOUNTER — Ambulatory Visit (HOSPITAL_COMMUNITY)
Admission: RE | Admit: 2020-06-06 | Discharge: 2020-06-06 | Disposition: A | Discharge status: Home / Self Care | Payer: Medicare Other | Source: Ambulatory Visit | Attending: Cardiovascular Disease | Admitting: Cardiovascular Disease

## 2020-06-06 DIAGNOSIS — I6523 Occlusion and stenosis of bilateral carotid arteries: Secondary | ICD-10-CM | POA: Diagnosis present

## 2020-08-25 ENCOUNTER — Other Ambulatory Visit: Payer: Medicare Other | Admitting: *Deleted

## 2020-08-25 ENCOUNTER — Other Ambulatory Visit: Payer: Self-pay

## 2020-08-25 ENCOUNTER — Encounter (INDEPENDENT_AMBULATORY_CARE_PROVIDER_SITE_OTHER): Payer: Self-pay

## 2020-08-25 DIAGNOSIS — E782 Mixed hyperlipidemia: Secondary | ICD-10-CM

## 2020-08-25 DIAGNOSIS — I25118 Atherosclerotic heart disease of native coronary artery with other forms of angina pectoris: Secondary | ICD-10-CM

## 2020-08-25 DIAGNOSIS — I6523 Occlusion and stenosis of bilateral carotid arteries: Secondary | ICD-10-CM

## 2020-08-25 DIAGNOSIS — I10 Essential (primary) hypertension: Secondary | ICD-10-CM

## 2020-08-25 DIAGNOSIS — Z951 Presence of aortocoronary bypass graft: Secondary | ICD-10-CM

## 2020-08-25 LAB — COMPREHENSIVE METABOLIC PANEL
ALT: 28 IU/L (ref 0–44)
AST: 28 IU/L (ref 0–40)
Albumin/Globulin Ratio: 1.7 (ref 1.2–2.2)
Albumin: 4.1 g/dL (ref 3.6–4.6)
Alkaline Phosphatase: 94 IU/L (ref 44–121)
BUN/Creatinine Ratio: 16 (ref 10–24)
BUN: 22 mg/dL (ref 8–27)
Bilirubin Total: 1.1 mg/dL (ref 0.0–1.2)
CO2: 26 mmol/L (ref 20–29)
Calcium: 9 mg/dL (ref 8.6–10.2)
Chloride: 104 mmol/L (ref 96–106)
Creatinine, Ser: 1.4 mg/dL — ABNORMAL HIGH (ref 0.76–1.27)
Globulin, Total: 2.4 g/dL (ref 1.5–4.5)
Glucose: 148 mg/dL — ABNORMAL HIGH (ref 65–99)
Potassium: 4.5 mmol/L (ref 3.5–5.2)
Sodium: 143 mmol/L (ref 134–144)
Total Protein: 6.5 g/dL (ref 6.0–8.5)
eGFR: 48 mL/min/{1.73_m2} — ABNORMAL LOW (ref >59)

## 2020-08-25 LAB — LIPID PANEL
Chol/HDL Ratio: 3.3 ratio (ref 0.0–5.0)
Cholesterol, Total: 117 mg/dL (ref 100–199)
HDL: 35 mg/dL — ABNORMAL LOW (ref >39)
LDL Chol Calc (NIH): 62 mg/dL (ref 0–99)
Triglycerides: 105 mg/dL (ref 0–149)
VLDL Cholesterol Cal: 20 mg/dL (ref 5–40)

## 2021-02-12 ENCOUNTER — Other Ambulatory Visit (HOSPITAL_BASED_OUTPATIENT_CLINIC_OR_DEPARTMENT_OTHER): Payer: Self-pay | Admitting: Interventional Cardiology

## 2021-02-12 DIAGNOSIS — I6523 Occlusion and stenosis of bilateral carotid arteries: Secondary | ICD-10-CM

## 2021-05-11 ENCOUNTER — Telehealth: Payer: Self-pay

## 2021-05-11 NOTE — Telephone Encounter (Signed)
NOTES SCANNED TO REFERRAL 

## 2021-05-11 NOTE — Telephone Encounter (Signed)
ERROR

## 2021-05-22 NOTE — Progress Notes (Signed)
Cardiology Office Note   Date:  05/23/2021   ID:  Jerry [name], DOB [birth year], MRN [id number]  PCP:  Charlies Silvers, PA-C    Chief Complaint  Patient presents with   Follow-up   CAD  Wt Readings from Last 3 Encounters:  05/23/21 223 lb (101.2 kg)  05/29/20 239 lb 9.6 oz (108.7 kg)  06/02/19 229 lb 12.8 oz (104.2 kg)       History of Present Illness: Jerry [name] is a 88 y.o. male   Who has had CAD.  He was followed by Dr. Myrtis Ser for many years.  He had CABG in 2002.  He has not had any PCI since the CABG.  Stress tests were normal after the CABG in 2007, 2012.  No palpitations.  No chest pain.  No NTG usage.   He had hip surgery in 2016 and did well.  No longer taking Xarelto.  He is taking a baby aspirin without problems.    In the past, he was walking on the treadmill and did some light weights and bicycle.    He was diagnosed with the flu in early January 2019 and was treated with Tamiflu.   Atorvastatin was increased to 80 mg daily in January 2022. He was seen at [facility] in [name] 2022 when he had the flu.  Records from there show that he had atrial fibrillation with rapid ventricular response.  He did not feel sx of the AFib.  He was discharged to a nursing home for 3 weeks. Back home now- lives alone.  He continues to drive.    Denies : Chest pain. Dizziness. Leg edema. Nitroglycerin use. Orthopnea. Palpitations. Paroxysmal nocturnal dyspnea. Shortness of breath at rest. Syncope.    Some DOE- but activity is limited. Son helps and lives just a few miles away.   REcords from PMD in jan 2023 show: "Jerry [name] was hospitalized 03/20/21-04/02/21 at [facility]. He presented to the ED with cough, SOB, nausea, and vomiting x 1 week. He was found to be in atrial fibrillation with RVR with pulse in the 140's. He was also diagnosed with Flu A/viral pneumonia.  Jerry [name] was started on apixaban. His metoprolol from home was increased. Echo with normal EF and no wall motion  abnormalities. Cardiolite stress test was negative There was mild apical hypokinesis with normal LVEF.  He completed 5 days of antibiotics. He received steroids. He was discharged with a prednisone taper.  He experienced delirium. Depakote at bedtime has helped that.  Jerry [name] says he is doing better. He is mentally almost back to baseline. Not driving. Jerry [name] will not let him drive. He is using a pill Dance movement psychotherapist. The family got him a big calendar. Family helping him a lot. Has a Life Alert now. He is still a little unsteady on his feet, but that improved after a stay at rehab and PT coming to the house 2 times per week. He has exercises he does on his own. He even has a stationary bike at home next to his recliner. A nurse comes once per week to see how he is doing.  He was started on metformin 2 times per day for his T2DM. He was diet-controlled at his last visit with me with A1c under 7."  Past Medical History:  Diagnosis Date   Arthritis    knees and shoulders    CAD (coronary artery disease)    no ischemia nuclear scan 12/2005 EF60% /  nuclear, October, 2012, no ischemia, EF normal  Carotid artery disease (HCC)    Doppler, October, 2011, 40-59% bilateral   Dyslipidemia    under control   Ejection fraction    EF. 60%, Nuclear, 2007 /   EF, 60%, nuclear, October, 2012   GERD (gastroesophageal reflux disease)    Hearing aid worn    bilateral ears    Hiatal hernia    Hx of CABG    2002   Hypertension    Medication intolerance    NIACIN   Myocardial infarction (HCC)    1996   Neuropathy    Peptic ulcer disease    "just one little ulcer"   Pneumonia    hx of x1   Weight loss    August, 2012    Past Surgical History:  Procedure Laterality Date   CARDIAC CATHETERIZATION     CORONARY ARTERY BYPASS GRAFT     5   HAND SURGERY Right ~1998   JOINT REPLACEMENT Bilateral 2011, 2013   knees   TOTAL HIP ARTHROPLASTY Right 09/14/2014   Procedure: RIGHT TOTAL HIP ARTHROPLASTY ANTERIOR  APPROACH;  Surgeon: Ollen Gross, MD;  Location: [facility];  Service: Orthopedics;  Laterality: Right;   TOTAL KNEE ARTHROPLASTY  05/13/2011   Procedure: TOTAL KNEE ARTHROPLASTY;  Surgeon: Loanne Drilling, MD;  Location: [facility];  Service: Orthopedics;  Laterality: Right;     Current Outpatient Medications  Medication Sig Dispense Refill   amLODipine (NORVASC) 10 MG tablet TAKE ONE TABLET BY MOUTH ONCE DAILY. 90 tablet 2   apixaban (ELIQUIS) 5 MG TABS tablet Take 1 tablet by mouth in the morning and at bedtime.     aspirin EC 81 MG tablet Take 1 tablet (81 mg total) by mouth daily.     atorvastatin (LIPITOR) 80 MG tablet Take 1 tablet (80 mg total) by mouth daily. 90 tablet 3   fluticasone (FLONASE) 50 MCG/ACT nasal spray 1 spray.     gabapentin (NEURONTIN) 600 MG tablet Take 600 mg by mouth 2 (two) times daily.     metoprolol succinate (TOPROL-XL) 50 MG 24 hr tablet Take 1 tablet (50 mg total) by mouth daily. Take with or immediately following a meal. 90 tablet 1   nitroGLYCERIN (NITROSTAT) 0.4 MG SL tablet Place 1 tablet (0.4 mg total) under the tongue every 5 (five) minutes as needed for chest pain. 25 tablet 3   polyethylene glycol (MIRALAX / GLYCOLAX) packet Take 17 g by mouth daily as needed for mild constipation. 14 each 0   ranitidine (ZANTAC) 300 MG capsule Take 300 mg by mouth daily.     tiZANidine (ZANAFLEX) 2 MG tablet Take 2 mg by mouth daily.     No current facility-administered medications for this visit.    Allergies:   Duloxetine and Niacin    Social History:  The patient  reports that he quit smoking about 27 years ago. His smoking use included cigarettes and cigars. He has never used smokeless tobacco. He reports that he does not drink alcohol and does not use drugs.   Family History:  The patient's family history includes Stroke in his father and mother.    ROS:  Please see the history of present illness.   Otherwise, review of systems are positive for recent flu  infection.   All other systems are reviewed and negative.    PHYSICAL EXAM: VS:  BP 110/64    Pulse 65    Ht 6\' 1"  (1.854 m)    Wt 223 lb (101.2 kg)  SpO2 96%    BMI 29.42 kg/m  , BMI Body mass index is 29.42 kg/m. GEN: Well nourished, well developed, in no acute distress HEENT: normal Neck: no JVD, carotid bruits, or masses Cardiac: RRR, premature beats; no murmurs, rubs, or gallops,; tr ankle  edema  Respiratory:  clear to auscultation bilaterally, normal work of breathing GI: soft, nontender, nondistended, + BS MS: no deformity or atrophy Skin: warm and dry, no rash Neuro:  Strength and sensation are intact Psych: euthymic mood, full affect   EKG:   The ekg ordered today demonstrates NSR, PACs   Recent Labs: 08/25/2020: ALT 28; BUN 22; Creatinine, Ser 1.40; Potassium 4.5; Sodium 143   Lipid Panel    Component Value Date/Time   CHOL 117 08/25/2020 0750   TRIG 105 08/25/2020 0750   HDL 35 (L) 08/25/2020 0750   CHOLHDL 3.3 08/25/2020 0750   CHOLHDL 3 01/07/2011 0918   VLDL 17.2 01/07/2011 0918   LDLCALC 62 08/25/2020 0750     Other studies Reviewed: Additional studies/ records that were reviewed today with results demonstrating: Hospital records from [place] reviewed, labs reviewed.   ASSESSMENT AND PLAN:  CAD: Prior CABG. LDL 59 in 02/2021.  Continue aspirin.  No bleeding problems since starting Eliquis in addition to the aspirin. Atrial fibrillation: Occurred while he had a severe flu infection and was hospitalized.  Per the records, echo showed normal LV function.  Stress test was negative for ischemia.  He is back in normal sinus rhythm.  He has some unsteadiness when he walks.  He has not fallen nor had bleeding problems but given that his atrial fibrillation was episodic in the setting of another illness, will stop Eliquis.  We discussed the risk versus benefit in terms of stroke prevention versus bleeding.  I offered to speak to his son Jerry [name] but the patient  declined.  He is comfortable stopping the Eliquis and just continuing aspirin. Hypertension: The current medical regimen is effective;  continue present plan and medications. Carotid artery disease: Mild in the past.  Continue healthy diet and lipid-lowering therapy. Hyperlipidemia: Atorvastatin was increased to 80 mg daily in January 2022.  LDL well controlled in [name] 2022.  LDL 59 in [name] 2022.   Current medicines are reviewed at length with the patient today.  The patient concerns regarding his medicines were addressed.  The following changes have been made: Stop Eliquis due to bleeding risk  Labs/ tests ordered today include:  No orders of the defined types were placed in this encounter.   Recommend 150 minutes/week of aerobic exercise Low fat, low carb, high fiber diet recommended  Disposition:   FU in 1 year   Signed, Lance Muss, MD  05/23/2021 12:00 PM    [facility] [address] [place], [place], [place]  [address] Phone: [phone]; Fax: [phone]

## 2021-05-23 ENCOUNTER — Encounter: Payer: Self-pay | Admitting: Interventional Cardiology

## 2021-05-23 ENCOUNTER — Ambulatory Visit (INDEPENDENT_AMBULATORY_CARE_PROVIDER_SITE_OTHER): Payer: Medicare Other | Admitting: Interventional Cardiology

## 2021-05-23 ENCOUNTER — Other Ambulatory Visit: Payer: Self-pay

## 2021-05-23 VITALS — BP 110/64 | HR 65 | Ht 73.0 in | Wt 223.0 lb

## 2021-05-23 DIAGNOSIS — I6523 Occlusion and stenosis of bilateral carotid arteries: Secondary | ICD-10-CM | POA: Diagnosis not present

## 2021-05-23 DIAGNOSIS — I25118 Atherosclerotic heart disease of native coronary artery with other forms of angina pectoris: Secondary | ICD-10-CM

## 2021-05-23 DIAGNOSIS — E782 Mixed hyperlipidemia: Secondary | ICD-10-CM

## 2021-05-23 DIAGNOSIS — I10 Essential (primary) hypertension: Secondary | ICD-10-CM | POA: Diagnosis not present

## 2021-05-23 DIAGNOSIS — Z951 Presence of aortocoronary bypass graft: Secondary | ICD-10-CM

## 2021-05-23 NOTE — Patient Instructions (Signed)
Medication Instructions:  Your physician has recommended you make the following change in your medication:  Doreatha Martin current supply of Eliquis and then stop. Continue Asprin 81 mg by mouth daily  *If you need a refill on your cardiac medications before your next appointment, please call your pharmacy*   Lab Work: none If you have labs (blood work) drawn today and your tests are completely normal, you will receive your results only by: MyChart Message (if you have MyChart) OR A paper copy in the mail If you have any lab test that is abnormal or we need to change your treatment, we will call you to review the results.   Testing/Procedures: none   Follow-Up: At [facility], you and your health needs are our priority.  As part of our continuing mission to provide you with exceptional heart care, we have created designated Provider Care Teams.  These Care Teams include your primary Cardiologist (physician) and Advanced Practice Providers (APPs -  Physician Assistants and Nurse Practitioners) who all work together to provide you with the care you need, when you need it.  We recommend signing up for the patient portal called "MyChart".  Sign up information is provided on this After Visit Summary.  MyChart is used to connect with patients for Virtual Visits (Telemedicine).  Patients are able to view lab/test results, encounter notes, upcoming appointments, etc.  Non-urgent messages can be sent to your provider as well.   To learn more about what you can do with MyChart, go to [email or web address].    Your next appointment:   12 month(s)  The format for your next appointment:   In Person  Provider:   Lance Muss, MD     Other Instructions

## 2021-06-01 ENCOUNTER — Ambulatory Visit: Payer: Medicare Other | Admitting: Interventional Cardiology

## 2021-06-06 ENCOUNTER — Other Ambulatory Visit: Payer: Self-pay

## 2021-06-06 ENCOUNTER — Ambulatory Visit (HOSPITAL_COMMUNITY)
Admission: RE | Admit: 2021-06-06 | Discharge: 2021-06-06 | Disposition: A | Discharge status: Home / Self Care | Payer: Medicare Other | Source: Ambulatory Visit | Attending: Cardiovascular Disease | Admitting: Cardiovascular Disease

## 2021-06-06 DIAGNOSIS — I6523 Occlusion and stenosis of bilateral carotid arteries: Secondary | ICD-10-CM | POA: Diagnosis present

## 2022-06-17 ENCOUNTER — Encounter: Payer: Self-pay | Admitting: Physician Assistant

## 2022-06-17 ENCOUNTER — Ambulatory Visit: Payer: Medicare Other | Attending: Physician Assistant | Admitting: Physician Assistant

## 2022-06-17 VITALS — BP 144/60 | HR 76 | Ht 73.0 in | Wt 234.0 lb

## 2022-06-17 DIAGNOSIS — I4891 Unspecified atrial fibrillation: Secondary | ICD-10-CM | POA: Diagnosis present

## 2022-06-17 DIAGNOSIS — E785 Hyperlipidemia, unspecified: Secondary | ICD-10-CM | POA: Diagnosis present

## 2022-06-17 DIAGNOSIS — I10 Essential (primary) hypertension: Secondary | ICD-10-CM | POA: Insufficient documentation

## 2022-06-17 DIAGNOSIS — I6523 Occlusion and stenosis of bilateral carotid arteries: Secondary | ICD-10-CM | POA: Insufficient documentation

## 2022-06-17 DIAGNOSIS — I2510 Atherosclerotic heart disease of native coronary artery without angina pectoris: Secondary | ICD-10-CM | POA: Diagnosis present

## 2022-06-17 NOTE — Patient Instructions (Signed)
Medication Instructions:  Your physician recommends that you continue on your current medications as directed. Please refer to the Current Medication list given to you today.  *If you need a refill on your cardiac medications before your next appointment, please call your pharmacy*   Lab Work: None If you have labs (blood work) drawn today and your tests are completely normal, you will receive your results only by: [place] (if you have MyChart) OR A paper copy in the mail If you have any lab test that is abnormal or we need to change your treatment, we will call you to review the results.   Follow-Up: At [facility], you and your health needs are our priority.  As part of our continuing mission to provide you with exceptional heart care, we have created designated Provider Care Teams.  These Care Teams include your primary Cardiologist (physician) and Advanced Practice Providers (APPs -  Physician Assistants and Nurse Practitioners) who all work together to provide you with the care you need, when you need it.  We recommend signing up for the patient portal called "MyChart".  Sign up information is provided on this After Visit Summary.  MyChart is used to connect with patients for Virtual Visits (Telemedicine).  Patients are able to view lab/test results, encounter notes, upcoming appointments, etc.  Non-urgent messages can be sent to your provider as well.   To learn more about what you can do with MyChart, go to [email or web address].    Your next appointment:   1 year(s)  Provider:   Larae Grooms, MD   Other Instructions 1.Omron is the recommended brand of home blood pressure monitor that we discussed today 2.Check your blood pressure daily, one hour after taking your morning medications for 2 weeks and keep a log. Call us at the end of the 2 weeks to provide [place] with the readings.

## 2022-06-17 NOTE — Progress Notes (Addendum)
Office Visit    Patient Name: Jerry [name] Date of Encounter: 06/17/2022  PCP:  Couillard, Jennifer, [place]  Cardiologist:  Larae Grooms, MD  Advanced Practice Provider:  No care team member to display Electrophysiologist:  None   HPI    Jerry [name] is a 89 y.o. male with a past medical history of CAD status post CABG in 2002, stress test after CABG in 2007, 2012 negative, hypertension, hyperlipidemia, GERD presents today for follow-up appointment.  He was last seen a year ago.  History includes hip surgery in 2016.  He did well with that.  No longer taking Xarelto.  He was taking baby aspirin without any issues.  In the past, he was doing some walking on a treadmill and some light weights as well as a bicycle.  Diagnosed with flu in early January 2019 and treated with Tamiflu.  Atorvastatin was increased to 80 mg daily January 2022.  Seen at [facility] November 2022 and had the flu.  Records from there showed that he had atrial fibrillation with rapid ventricular response.  He was asymptomatic with his atrial fibrillation.  He was discharged to nursing home for 3 weeks.  He was back home and lives alone.  Continue to drive.  Last year he was having some DOE with activity but his activity was limited.  His son helped him out and only lives a few miles away.  He was not driving at his last appointment.  Family was helping him out a lot.  He had gotten a life alert.  Unsteady on his feet but improved after stay rehab and physical therapy coming to his house 2 days a week.  He does exercise on his own as well.  He even has a stationary bike ride next to his recliner.  He was started on metformin twice a day for his diabetes.  He was diet controlled with A1c under 7.  Today, he tells me that his heart has not been at all.  He has some chronic shortness of breath when he does activity however he does not do much.  His family helps him out a lot whenever he  needs something he does have some balance issues but he uses a cane.  No issues with atrial fibrillation.  His Eliquis was discontinued last year.  No falls.  Reports no shortness of breath nor dyspnea on exertion. Reports no chest pain, pressure, or tightness. No edema, orthopnea, PND. Reports no palpitations.   Past Medical History    Past Medical History:  Diagnosis Date   Arthritis    knees and shoulders    CAD (coronary artery disease)    no ischemia nuclear scan 12/2005 EF60% /  nuclear, October, 2012, no ischemia, EF normal   Carotid artery disease ([place])    Doppler, October, 2011, 40-59% bilateral   Dyslipidemia    under control   Ejection fraction    EF. 60%, Nuclear, 2007 /   EF, 60%, nuclear, October, 2012   GERD (gastroesophageal reflux disease)    Hearing aid worn    bilateral ears    Hiatal hernia    Hx of CABG    2002   Hypertension    Medication intolerance    NIACIN   Myocardial infarction ([place])    1996   Neuropathy    Peptic ulcer disease    "just one little ulcer"   Pneumonia    hx of x1  Weight loss    August, 2012   Past Surgical History:  Procedure Laterality Date   CARDIAC CATHETERIZATION     CORONARY ARTERY BYPASS GRAFT     5   HAND SURGERY Right ~1998   JOINT REPLACEMENT Bilateral 2011, 2013   knees   TOTAL HIP ARTHROPLASTY Right 09/14/2014   Procedure: RIGHT TOTAL HIP ARTHROPLASTY ANTERIOR APPROACH;  Surgeon: Gaynelle Arabian, MD;  Location: [facility];  Service: Orthopedics;  Laterality: Right;   TOTAL KNEE ARTHROPLASTY  05/13/2011   Procedure: TOTAL KNEE ARTHROPLASTY;  Surgeon: Gearlean Alf, MD;  Location: [facility];  Service: Orthopedics;  Laterality: Right;    Allergies  Allergies  Allergen Reactions   Duloxetine Other (See Comments)    Double vision   Niacin Itching    Unknown      EKGs/Labs/Other Studies Reviewed:   The following studies were reviewed today:  Carotid artery disease 06/06/21 Summary:  Right Carotid: Velocities in  the right ICA are consistent with a 1-39%  stenosis.   Left Carotid: Velocities in the left ICA are consistent with a 1-39%  stenosis.   Vertebrals: Bilateral vertebral arteries demonstrate antegrade flow.  Subclavians: Normal flow hemodynamics were seen in bilateral subclavian               arteries.    EKG:  EKG is  ordered today.  The ekg ordered today demonstrates NSR rate 73 bpm,  incomplete RBBB, first degree AVB  Recent Labs: No results found for requested labs within last 365 days.  Recent Lipid Panel    Component Value Date/Time   CHOL 117 08/25/2020 0750   TRIG 105 08/25/2020 0750   HDL 35 (L) 08/25/2020 0750   CHOLHDL 3.3 08/25/2020 0750   CHOLHDL 3 01/07/2011 0918   VLDL 17.2 01/07/2011 0918   LDLCALC 62 08/25/2020 0750      Home Medications   Current Meds  Medication Sig   amLODipine (NORVASC) 10 MG tablet TAKE ONE TABLET BY MOUTH ONCE DAILY.   aspirin EC 81 MG tablet Take 1 tablet (81 mg total) by mouth daily.   atorvastatin (LIPITOR) 80 MG tablet Take 1 tablet (80 mg total) by mouth daily.   fluticasone (FLONASE) 50 MCG/ACT nasal spray 1 spray.   gabapentin (NEURONTIN) 600 MG tablet Take 600 mg by mouth 2 (two) times daily.   metoprolol succinate (TOPROL-XL) 50 MG 24 hr tablet Take 1 tablet (50 mg total) by mouth daily. Take with or immediately following a meal.   polyethylene glycol (MIRALAX / GLYCOLAX) packet Take 17 g by mouth daily as needed for mild constipation.   ranitidine (ZANTAC) 300 MG capsule Take 300 mg by mouth daily.   tiZANidine (ZANAFLEX) 2 MG tablet Take 2 mg by mouth daily.     Review of Systems      All other systems reviewed and are otherwise negative except as noted above.  Physical Exam    VS:  BP (!) 144/60   Pulse 76   Ht 6' 1"$  (1.854 m)   Wt 234 lb (106.1 kg)   SpO2 94%   BMI 30.87 kg/m  , BMI Body mass index is 30.87 kg/m.  Wt Readings from Last 3 Encounters:  06/17/22 234 lb (106.1 kg)  05/23/21 223 lb (101.2 kg)   05/29/20 239 lb 9.6 oz (108.7 kg)     GEN: Well nourished, well developed, in no acute distress. HEENT: normal. Neck: Supple, no JVD, carotid bruits, or masses. Cardiac: RRR, no murmurs,  rubs, or gallops. No clubbing, cyanosis, 1+ pitting LE edema R > L.  Radials/PT 2+ and equal bilaterally.  Respiratory:  Respirations regular and unlabored, clear to auscultation bilaterally. GI: Soft, nontender, nondistended. MS: No deformity or atrophy. Skin: Warm and dry, no rash. Neuro:  Strength and sensation are intact. Psych: Normal affect.  Assessment & Plan    CAD status post remote CABG -no chest pain or SOB -Continue current medications including aspirin 81 mg daily Lipitor 80 daily, metoprolol succinate 50 mg daily, nitro as needed -PCP to check cholesterol  Atrial fibrillation -occurred in the setting of a flu infection -Asymptomatic at this time -Normal sinus rhythm today -Eliquis stopped last year  Hypertension -144/60 on repeat -continue current medications -I asked him to track his BP at home and send me those values in 2 weeks -It was [id number] systolic a month ago when he went to see his PCP  Carotid artery disease -mild, continue healthy diet and lipid lowering therapy with Lipitor  Hyperlipidemia -we will need updated labs -last LDL 62  HYPERTENSION CONTROL Vitals:   06/17/22 1608 06/17/22 1633  BP: (!) 174/69 (!) 144/60    The patient's blood pressure is elevated above target today.  In order to address the patient's elevated BP: Blood pressure will be monitored at home to determine if medication changes need to be made.        Disposition: Follow up 1 year with Larae Grooms, MD or APP.  Signed, Jerry Collard, PA-C 06/17/2022, 4:40 PM  [facility]

## 2023-06-20 ENCOUNTER — Ambulatory Visit: Payer: Medicare Other | Admitting: Cardiovascular Disease
